# Patient Record
Sex: Male | Born: 1935 | Race: White | Hispanic: No | Marital: Married | Smoking: Former smoker
Health system: Southern US, Community
[De-identification: ages and names within clinical notes are randomized; demographics above are authoritative.]

## PROBLEM LIST (undated history)

## (undated) DIAGNOSIS — I495 Sick sinus syndrome: Secondary | ICD-10-CM

## (undated) DIAGNOSIS — C8307 Small cell B-cell lymphoma, spleen: Secondary | ICD-10-CM

## (undated) DIAGNOSIS — I4729 Other ventricular tachycardia: Secondary | ICD-10-CM

## (undated) DIAGNOSIS — I480 Paroxysmal atrial fibrillation: Secondary | ICD-10-CM

## (undated) DIAGNOSIS — I472 Ventricular tachycardia: Secondary | ICD-10-CM

## (undated) DIAGNOSIS — M40209 Unspecified kyphosis, site unspecified: Secondary | ICD-10-CM

## (undated) DIAGNOSIS — I2699 Other pulmonary embolism without acute cor pulmonale: Secondary | ICD-10-CM

## (undated) DIAGNOSIS — M810 Age-related osteoporosis without current pathological fracture: Secondary | ICD-10-CM

## (undated) DIAGNOSIS — E785 Hyperlipidemia, unspecified: Secondary | ICD-10-CM

## (undated) HISTORY — DX: Sick sinus syndrome: I49.5

## (undated) HISTORY — DX: Hyperlipidemia, unspecified: E78.5

## (undated) HISTORY — DX: Paroxysmal atrial fibrillation: I48.0

## (undated) HISTORY — DX: Ventricular tachycardia: I47.2

## (undated) HISTORY — DX: Other pulmonary embolism without acute cor pulmonale: I26.99

## (undated) HISTORY — DX: Small cell b-cell lymphoma, spleen: C83.07

## (undated) HISTORY — PX: PACEMAKER INSERTION: SHX728

## (undated) HISTORY — DX: Age-related osteoporosis without current pathological fracture: M81.0

## (undated) HISTORY — DX: Other ventricular tachycardia: I47.29

## (undated) HISTORY — DX: Unspecified kyphosis, site unspecified: M40.209

---

## 2001-03-09 ENCOUNTER — Ambulatory Visit (HOSPITAL_COMMUNITY): Admission: RE | Admit: 2001-03-09 | Discharge: 2001-03-09 | Payer: Self-pay | Admitting: Internal Medicine

## 2002-10-26 HISTORY — PX: TOTAL HIP ARTHROPLASTY: SHX124

## 2003-02-21 ENCOUNTER — Encounter: Payer: Self-pay | Admitting: Gastroenterology

## 2003-06-19 ENCOUNTER — Encounter: Admission: RE | Admit: 2003-06-19 | Discharge: 2003-09-07 | Payer: Self-pay

## 2003-08-23 ENCOUNTER — Inpatient Hospital Stay (HOSPITAL_COMMUNITY): Admission: EM | Admit: 2003-08-23 | Discharge: 2003-08-30 | Payer: Self-pay | Admitting: Emergency Medicine

## 2003-09-12 ENCOUNTER — Encounter (INDEPENDENT_AMBULATORY_CARE_PROVIDER_SITE_OTHER): Payer: Self-pay | Admitting: *Deleted

## 2003-09-23 ENCOUNTER — Inpatient Hospital Stay (HOSPITAL_COMMUNITY): Admission: EM | Admit: 2003-09-23 | Discharge: 2003-09-29 | Payer: Self-pay | Admitting: Emergency Medicine

## 2003-09-28 ENCOUNTER — Encounter: Payer: Self-pay | Admitting: Cardiology

## 2004-01-20 IMAGING — CT CT HEAD W/O CM
1 series · 15 of 30 positions shown, 19 images · non-contrast
Comparison: none

CLINICAL DATA: Subdural hematoma with drainage catheter placement. 
CT HEAD WITHOUT CONTRAST, 09/26/03

[Series 2: brain · axial · 0.47mm/px · z∈[+120,+261]mm · 15 of 30 slices shown, 19 images]
[im 2/30  brain]
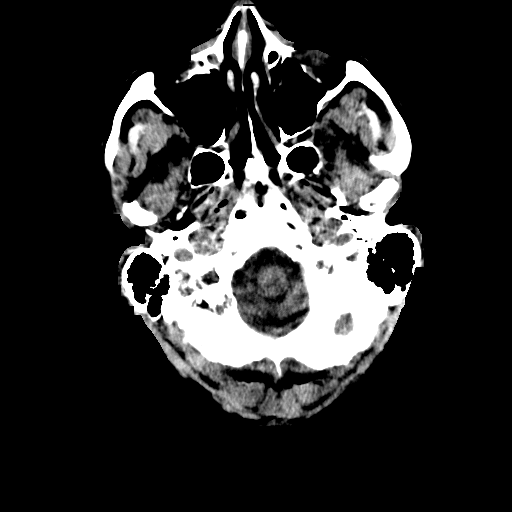
[im 2/30  bone]
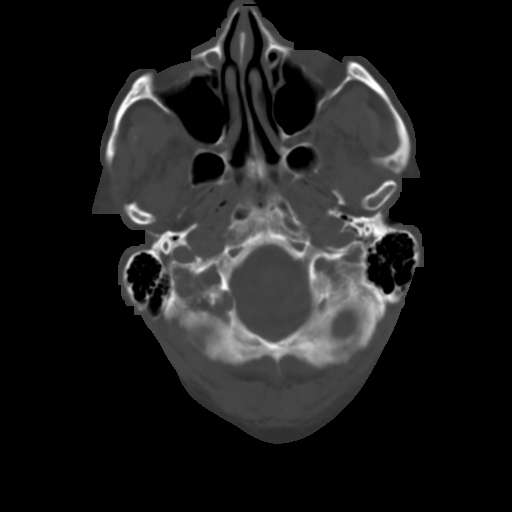
[im 4/30  brain]
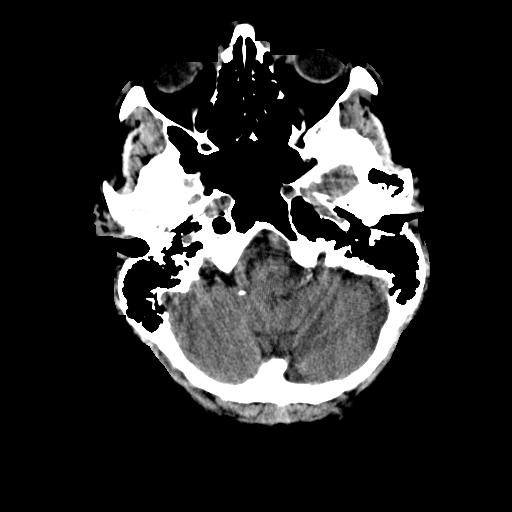
[im 6/30  brain]
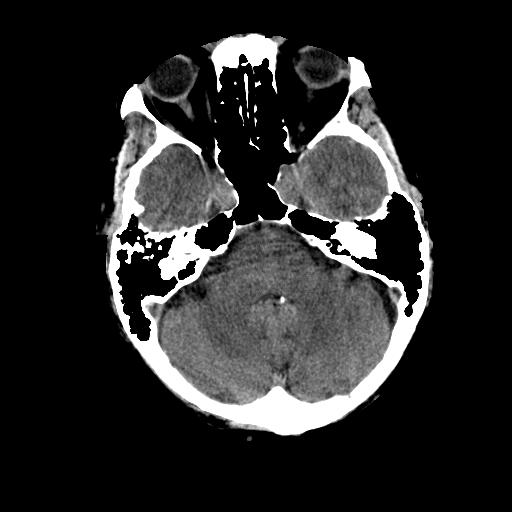
[im 8/30  brain]
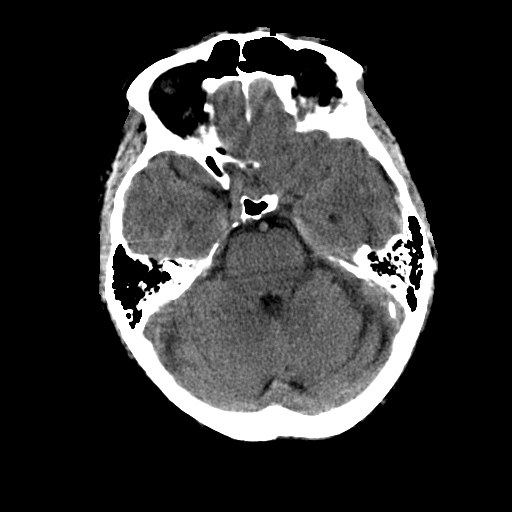
[im 10/30  brain]
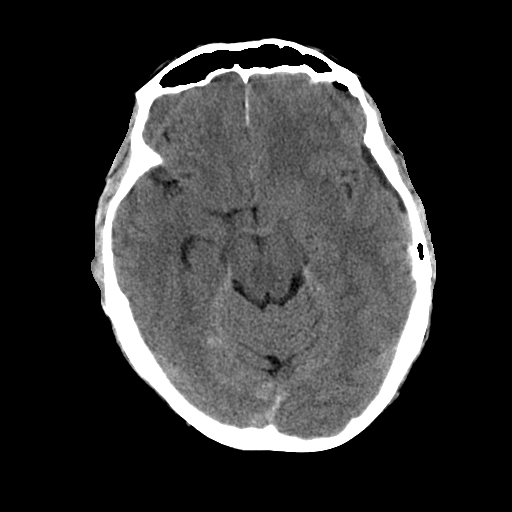
[im 10/30  bone]
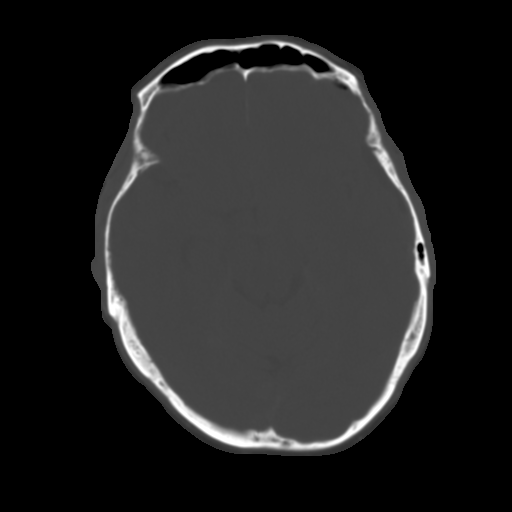
[im 12/30  brain]
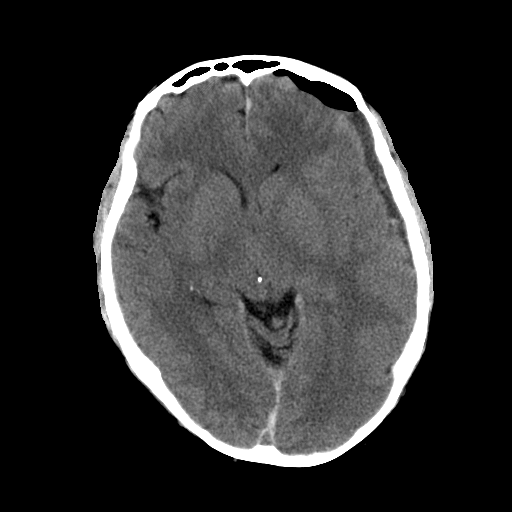
[im 14/30  brain]
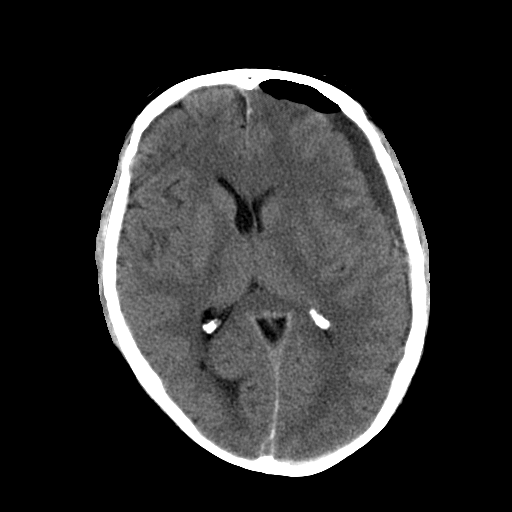
[im 16/30  brain]
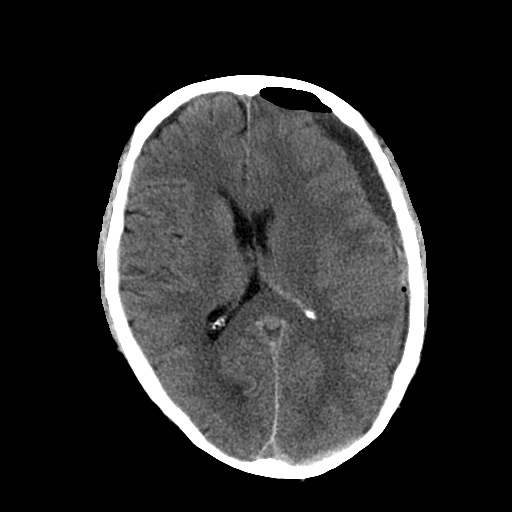
[im 17/30  brain]
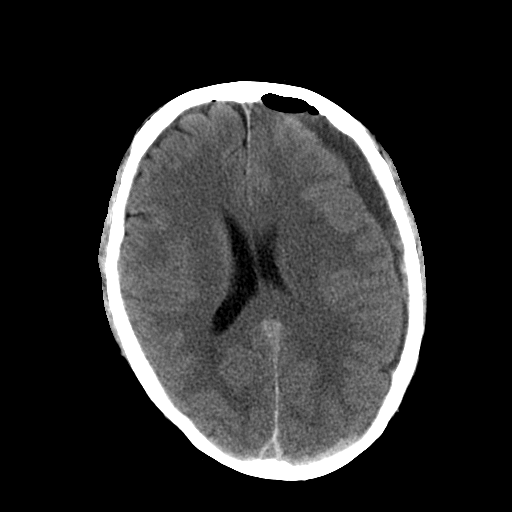
[im 17/30  bone]
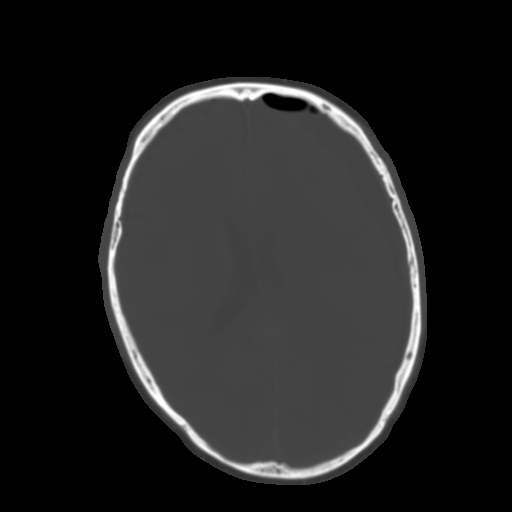
[im 19/30  brain]
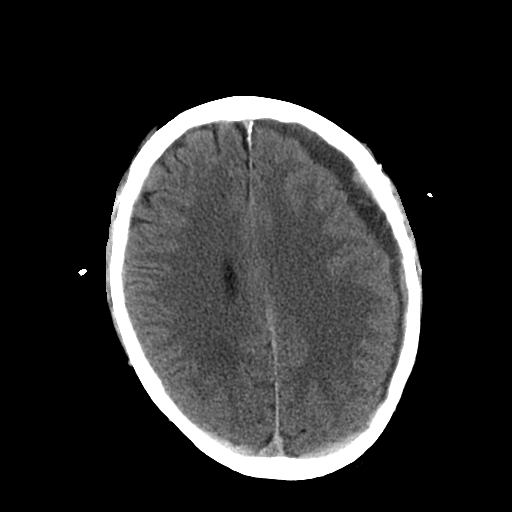
[im 21/30  brain]
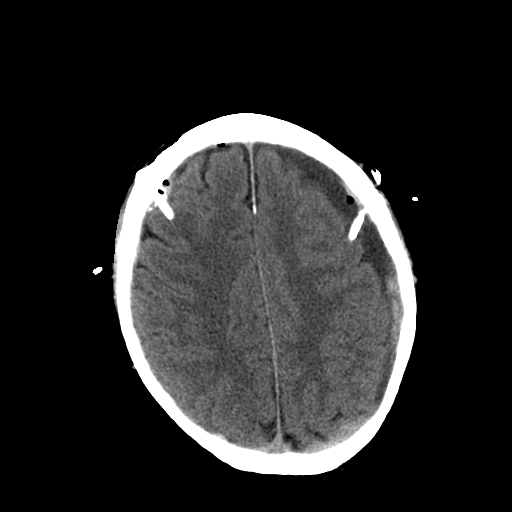
[im 23/30  brain]
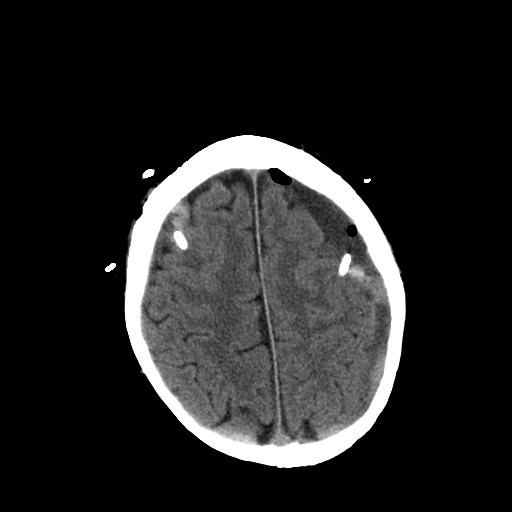
[im 25/30  brain]
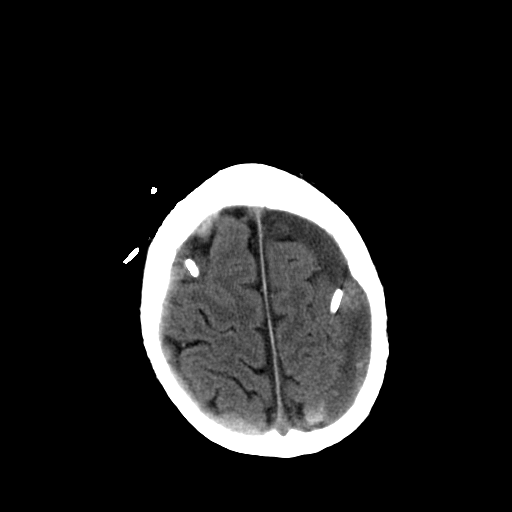
[im 25/30  bone]
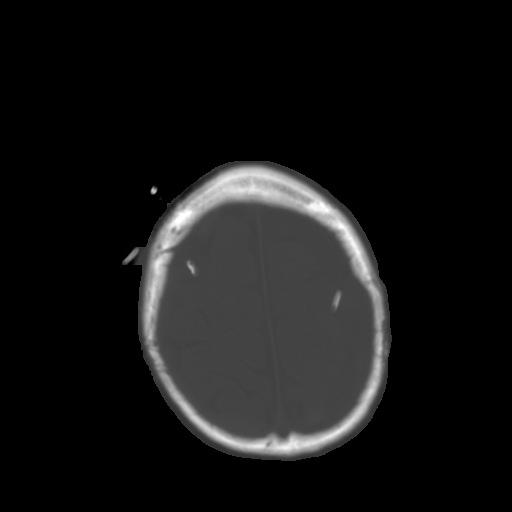
[im 27/30  brain]
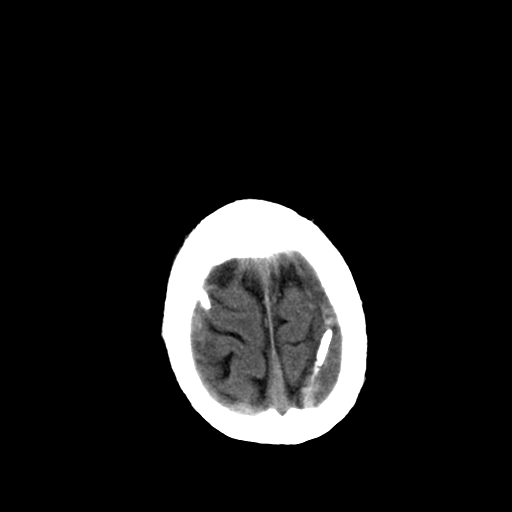
[im 29/30  brain]
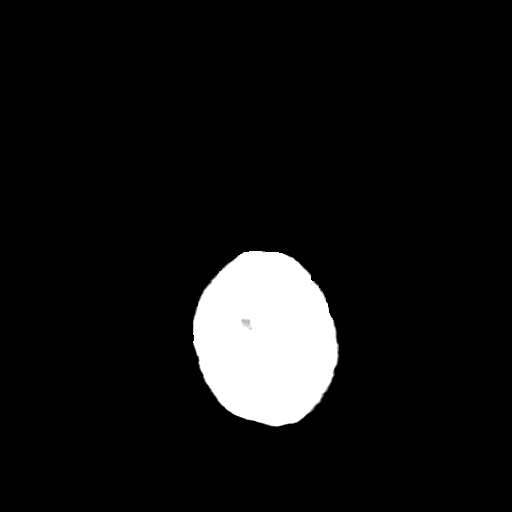

[15 of 30 positions shown; findings below may reference images not displayed]

FINDINGS: Comparison to 09/25/03.
Bilateral subdural drainage catheters are again noted with low density subdural fluid and areas of acute hemorrhage again noted.  There has been interval decrease in size of the right subdural collection with relatively stable left subdural fluid / hemorrhage.  There is increasing air within the left subdural space since the last study.  There is increasing mass effect on the left brain with increase in left to right midline shift of approximately 3-4 mm.  Mild mass effect on the left ventricular system is noted with minimal enlargement of the right lateral ventricle.  No other changes identified.  
IMPRESSION
Bilateral subdural drains again noted with decrease in right subdural fluid / hemorrhage and unchanged left subdural fluid / hemorrhage.  However increasing mass effect on the left frontal lobe with 3-4 mm left to right midline shift and slight increase in right ventricular size.  New left subdural air.  
Other scattered areas of subdural hematoma unchanged.

## 2006-01-26 ENCOUNTER — Ambulatory Visit: Payer: Self-pay | Admitting: Internal Medicine

## 2006-02-01 ENCOUNTER — Encounter: Payer: Self-pay | Admitting: Internal Medicine

## 2006-02-01 ENCOUNTER — Other Ambulatory Visit: Admission: RE | Admit: 2006-02-01 | Discharge: 2006-02-01 | Payer: Self-pay | Admitting: Internal Medicine

## 2006-02-01 LAB — CBC WITH DIFFERENTIAL/PLATELET
Basophils Absolute: 0 10*3/uL (ref 0.0–0.1)
HCT: 33.8 % — ABNORMAL LOW (ref 38.7–49.9)
HGB: 11.4 g/dL — ABNORMAL LOW (ref 13.0–17.1)
MCH: 32.2 pg (ref 28.0–33.4)
MONO#: 0.5 10*3/uL (ref 0.1–0.9)
NEUT%: 35.3 % — ABNORMAL LOW (ref 40.0–75.0)
lymph#: 4 10*3/uL — ABNORMAL HIGH (ref 0.9–3.3)

## 2006-02-02 LAB — IRON AND TIBC
%SAT: 35 % (ref 20–55)
Iron: 91 ug/dL (ref 42–165)
TIBC: 260 ug/dL (ref 215–435)
UIBC: 169 ug/dL

## 2006-02-02 LAB — FERRITIN: Ferritin: 145 ng/mL (ref 22–322)

## 2006-02-02 LAB — VITAMIN B12: Vitamin B-12: 725 pg/mL (ref 211–911)

## 2006-03-03 ENCOUNTER — Encounter: Admission: RE | Admit: 2006-03-03 | Discharge: 2006-03-03 | Payer: Self-pay | Admitting: Internal Medicine

## 2006-03-05 ENCOUNTER — Encounter (INDEPENDENT_AMBULATORY_CARE_PROVIDER_SITE_OTHER): Payer: Self-pay | Admitting: Specialist

## 2006-03-05 ENCOUNTER — Ambulatory Visit: Payer: Self-pay | Admitting: Internal Medicine

## 2006-03-05 ENCOUNTER — Encounter: Payer: Self-pay | Admitting: Internal Medicine

## 2006-03-05 ENCOUNTER — Ambulatory Visit (HOSPITAL_COMMUNITY): Admission: RE | Admit: 2006-03-05 | Discharge: 2006-03-05 | Payer: Self-pay | Admitting: Internal Medicine

## 2006-03-10 LAB — CBC WITH DIFFERENTIAL/PLATELET
BASO%: 0.3 % (ref 0.0–2.0)
Basophils Absolute: 0 10*3/uL (ref 0.0–0.1)
EOS%: 4.1 % (ref 0.0–7.0)
Eosinophils Absolute: 0.4 10*3/uL (ref 0.0–0.5)
HCT: 32.9 % — ABNORMAL LOW (ref 38.7–49.9)
HGB: 11.3 g/dL — ABNORMAL LOW (ref 13.0–17.1)
LYMPH%: 50.8 % — ABNORMAL HIGH (ref 14.0–48.0)
MCH: 33 pg (ref 28.0–33.4)
MCHC: 34.4 g/dL (ref 32.0–35.9)
MCV: 96 fL (ref 81.6–98.0)
MONO#: 0.7 10*3/uL (ref 0.1–0.9)
MONO%: 7.7 % (ref 0.0–13.0)
NEUT#: 3.3 10*3/uL (ref 1.5–6.5)
NEUT%: 37.1 % — ABNORMAL LOW (ref 40.0–75.0)
Platelets: 124 10*3/uL — ABNORMAL LOW (ref 145–400)
RBC: 3.43 10*6/uL — ABNORMAL LOW (ref 4.20–5.71)
RDW: 13.4 % (ref 11.2–14.6)
WBC: 8.8 10*3/uL (ref 4.0–10.0)
lymph#: 4.5 10*3/uL — ABNORMAL HIGH (ref 0.9–3.3)

## 2006-03-10 LAB — LACTATE DEHYDROGENASE: LDH: 195 U/L (ref 94–250)

## 2006-10-26 DIAGNOSIS — I495 Sick sinus syndrome: Secondary | ICD-10-CM

## 2006-10-26 HISTORY — DX: Sick sinus syndrome: I49.5

## 2007-07-13 ENCOUNTER — Encounter: Admission: RE | Admit: 2007-07-13 | Discharge: 2007-07-13 | Payer: Self-pay | Admitting: Cardiology

## 2007-07-26 ENCOUNTER — Ambulatory Visit (HOSPITAL_COMMUNITY): Admission: RE | Admit: 2007-07-26 | Discharge: 2007-07-27 | Payer: Self-pay | Admitting: *Deleted

## 2007-08-26 ENCOUNTER — Emergency Department (HOSPITAL_COMMUNITY): Admission: EM | Admit: 2007-08-26 | Discharge: 2007-08-26 | Payer: Self-pay | Admitting: Emergency Medicine

## 2007-12-20 IMAGING — CT CT PELVIS W/O CM
2 of 5 series · 4 of 32 positions shown, 7 images · IV contrast (agent unspecified)
Comparison: none

CLINICAL DATA: Fall.  Left hip pain.
HEAD CT WITHOUT CONTRAST:
TECHNIQUE: Contiguous axial images were obtained from the base of the skull through the vertex according to standard protocol without contrast.
TECHNIQUE: Multidetector CT imaging of the pelvis was performed following the standard protocol without IV contrast.

[Series 5: pelvis_hip 3.0 b60f st · axial · 0.74mm/px · z∈[+592,+708]mm · 2 of 117 slices shown, 5 images (1 of 2)]
[im 39/117  soft-tissue]
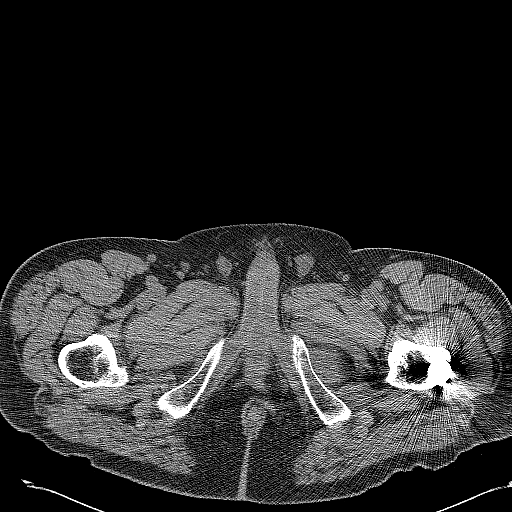
[im 39/117  lung]
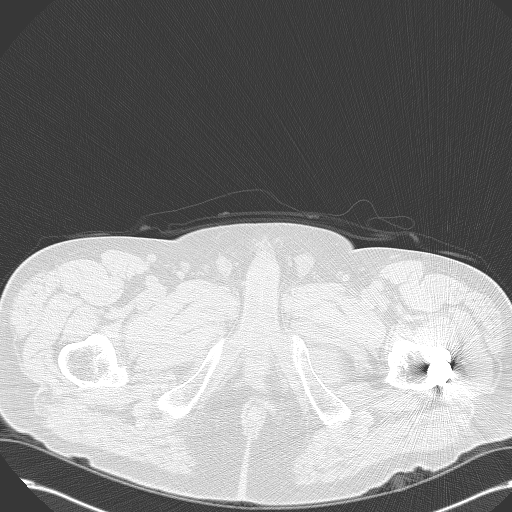
[im 39/117  bone]
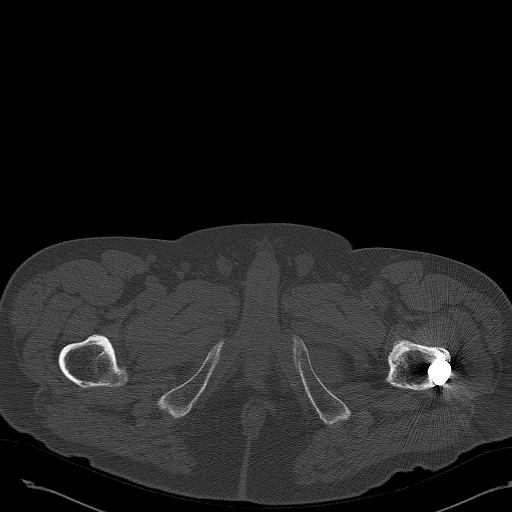
[im 78/117  soft-tissue]
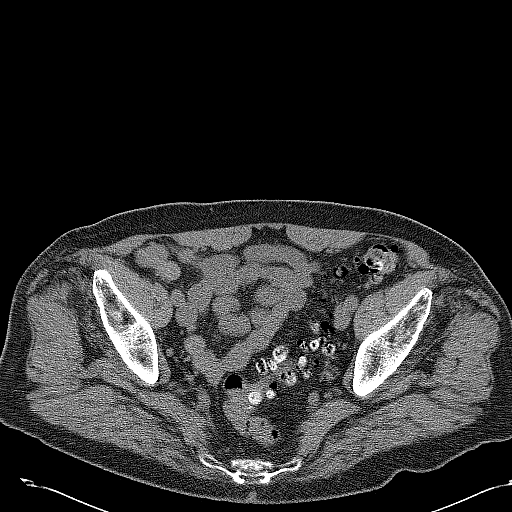
[im 78/117  lung]
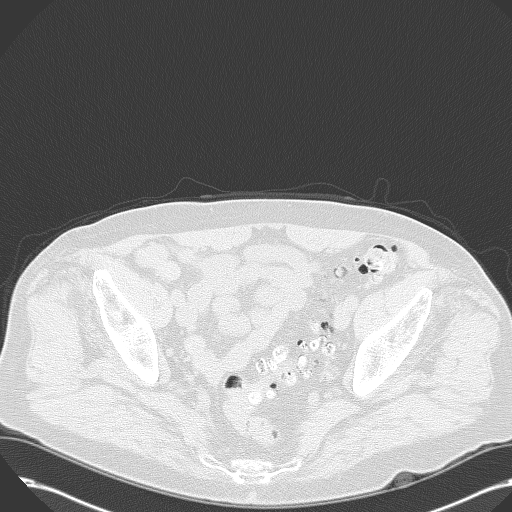

[Series 8: pelvis_hip 3.0 b60f st · axial · 0.74mm/px · z∈[+592,+708]mm · 2 of 117 slices shown (2 of 2)]
[im 39/117  soft-tissue]
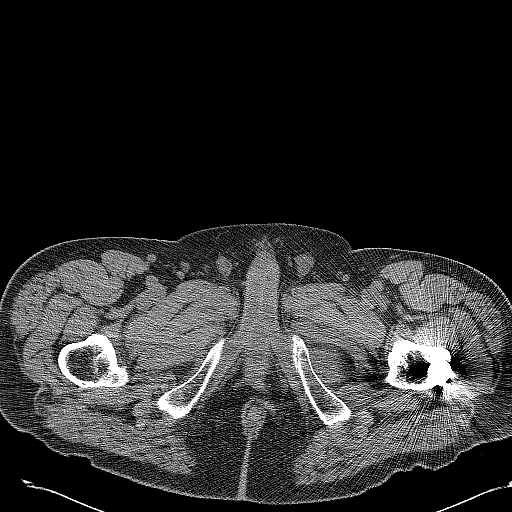
[im 78/117  soft-tissue]
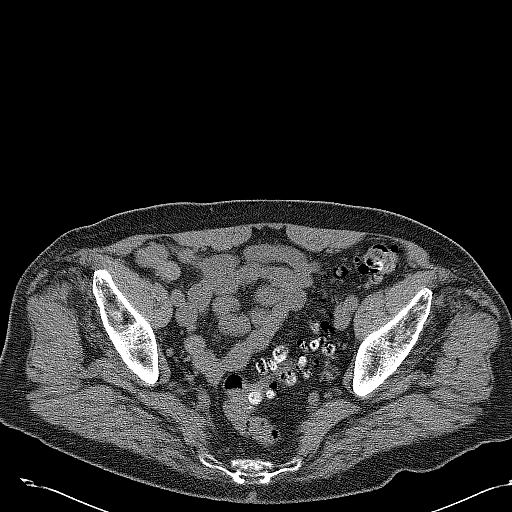

[4 of 32 positions shown; findings below may reference images not displayed]

FINDINGS: No intracranial hemorrhage.  No CT evidence of large acute infarct.  No intracranial mass detected on this unenhanced exam.  No skull fracture.
IMPRESSION: No intracranial hemorrhage or skull fracture. 
PELVIS CT WITHOUT CONTRAST:
FINDINGS: Prior surgery of the left hip with side plate and screws traversing through the femoral neck region.  Small acute fracture inferior left pubic ramus, otherwise no fracture noted.  Enlarged prostate gland.  Clinical and laboratory correlation is recommended.   Sigmoid diverticula.  Degenerative changes of the lower lumbar spine with multifactorial spinal stenosis.
IMPRESSION: 1. Fracture of the left inferior pubic ramus.  
2. Sigmoid diverticulosis.
3. Enlarged prostate gland.
4. Spinal stenosis, lower lumbar region.

## 2010-03-26 ENCOUNTER — Encounter: Payer: Self-pay | Admitting: Gastroenterology

## 2010-04-04 ENCOUNTER — Encounter: Payer: Self-pay | Admitting: Gastroenterology

## 2010-04-04 ENCOUNTER — Encounter (INDEPENDENT_AMBULATORY_CARE_PROVIDER_SITE_OTHER): Payer: Self-pay | Admitting: *Deleted

## 2010-05-06 ENCOUNTER — Encounter: Payer: Self-pay | Admitting: Gastroenterology

## 2010-05-26 ENCOUNTER — Ambulatory Visit: Payer: Self-pay | Admitting: Gastroenterology

## 2010-05-26 DIAGNOSIS — Z860100 Personal history of colon polyps, unspecified: Secondary | ICD-10-CM | POA: Insufficient documentation

## 2010-05-26 DIAGNOSIS — K5730 Diverticulosis of large intestine without perforation or abscess without bleeding: Secondary | ICD-10-CM | POA: Insufficient documentation

## 2010-06-03 ENCOUNTER — Ambulatory Visit: Payer: Self-pay | Admitting: Gastroenterology

## 2010-06-06 ENCOUNTER — Encounter: Payer: Self-pay | Admitting: Gastroenterology

## 2010-11-25 NOTE — Letter (Signed)
Summary: [facility]  [facility]   Imported By: Lester [place] 06/03/2010 09:01:47  _____________________________________________________________________  External Attachment:    Type:   Image     Comment:   External Document

## 2010-11-25 NOTE — Letter (Signed)
Summary: [facility]  [facility]   Imported By: Lester [place] 06/03/2010 08:58:30  _____________________________________________________________________  External Attachment:    Type:   Image     Comment:   External Document

## 2010-11-25 NOTE — Letter (Signed)
Summary: New Patient letter  [facility]  [address] [place], [place] [address]   Phone: [phone]  Fax: [phone]       04/04/2010 MRN: [id number]  [facility] [address] [place], [place]  [address]  Dear David [name],  Welcome to the Gastroenterology Division at [facility].    You are scheduled to see Dr.  Russella Dar on 05-26-10 at 10am on the 3rd floor at [facility], [address]  We ask that you try to arrive at our office 15 minutes prior to your appointment time to allow for check-in.  We would like you to complete the enclosed self-administered evaluation form prior to your visit and bring it with you on the day of your appointment.  We will review it with you.  Also, please bring a complete list of all your medications or, if you prefer, bring the medication bottles and we will list them.  Please bring your insurance card so that we may make a copy of it.  If your insurance requires a referral to see a specialist, please bring your referral form from your primary care physician.  Co-payments are due at the time of your visit and may be paid by cash, check or credit card.     Your office visit will consist of a consult with your physician (includes a physical exam), any laboratory testing he/she may order, scheduling of any necessary diagnostic testing (e.g. x-ray, ultrasound, CT-scan), and scheduling of a procedure (e.g. Endoscopy, Colonoscopy) if required.  Please allow enough time on your schedule to allow for any/all of these possibilities.    If you cannot keep your appointment, please call [phone] to cancel or reschedule prior to your appointment date.  This allows [place] the opportunity to schedule an appointment for another patient in need of care.  If you do not cancel or reschedule by 5 p.m. the business day prior to your appointment date, you will be charged a $50.00 late cancellation/no-show fee.    Thank you for  choosing [facility] for your medical needs.  We appreciate the opportunity to care for you.  Please visit [place] at our website  to learn more about our practice.                     Sincerely,                                                             The Gastroenterology Division

## 2010-11-25 NOTE — Discharge Summary (Signed)
Summary: Pulmonary Embolism   NAME:  David [name], David [name]                       ACCOUNT NO.:  [id number]   MEDICAL RECORD NO.:  [id number]                   PATIENT TYPE:  INP   LOCATION:  5150                                 FACILITY:  [facility]   PHYSICIAN:  David A. Perini, M.D.                DATE OF BIRTH:  07-13-1926   DATE OF ADMISSION:  08/23/2003  DATE OF DISCHARGE:  08/30/2003                                 DISCHARGE SUMMARY   DISCHARGE DIAGNOSES:  1. Moderate-sized left lower lobe distribution pulmonary embolism.  2. Recent open reduction internal fixation of a left hip fracture in August     of 2004.  3. Hemoptysis due to #1, resolving at the time of discharge.  4. Allergic rhinitis.   HISTORY OF PRESENT ILLNESS:  David [name] is a very pleasant 77 year old  gentleman with a fairly benign past medical history; however, he did suffer  a left hip fracture in August of 2004 and underwent open reduction internal  fixation. He was treated with anticoagulation for a few weeks and then this  was discontinued, and he was placed on aspirin. Recently, the patient did  have some swelling and pain in his left lower extremity. Furthermore, he had  been riding in his car on a few trips recently. He awoke at 4 a.m. on the  morning of admission with a sudden onset of chest pain which was pleuritic  in nature. He called our office and was advised to present to the emergency  room. There he was subsequently found to have a pulmonary embolism involving  his left lower lobe pulmonary arterial distribution. There was also some  sign of pulmonary infarct.   HOSPITAL COURSE:  David [name] was admitted to a telemetry bed. He was  placed on Lovenox and Coumadin therapy. He did have some slight hemoptysis  which resolved readily. He remained stable from a cardiovascular standpoint  and did not have significant hypoxia. He did have some low grade  temperatures initially which resolved without further  measures. He was  somewhat resistant to Coumadin and required higher doses to obtain an INR of  greater than 2.0. By August 30, 2003, he was therapeutic on his  anticoagulation, and he was discharged home to the care of his family.   DISCHARGE PHYSICAL EXAM:  VITAL SIGNS:  The patient was afebrile.  Temperature 98, pulse 50, respiratory rate 20, blood pressure 109/67, 96%  saturation on room air.  LUNGS:  Lung exam revealed essentially clear lungs bilaterally.  HEART:  Heart was regular rate and rhythm with no murmur.  ABDOMEN:  Abdomen was soft and benign.  EXTREMITIES:  There was no peripheral edema noted.   DISCHARGE LABORATORY DATA:  White count 7.9 with a normal differential,  hemoglobin 12.2, platelet count 261,000. INR 2.0. Sodium 139, potassium 4.2,  chloride 105, CO2 28, BUN 22, creatinine 1.3, glucose 95. ALT 45, albumin  2.8.   DISCHARGE INSTRUCTIONS:  David [name] was discharged on Coumadin therapy. He  was advised on Coumadin education. He is to followup promptly in the office  for frequent INR checks at our office. He is to followup with [place] in one week  for outpatient followup. He is to call if there are any questions or  concerns. He was also discharged on some p.r.n. Vicodin as well.                                                David [name], M.D.    MAP/MEDQ  D:  09/12/2003  T:  09/13/2003  Job:  [id number]

## 2010-11-25 NOTE — Letter (Signed)
Summary: Patient Notice- Polyp Results  [facility]  [address] [place], [place] [address]   Phone: [phone]  Fax: [phone]        June 06, 2010 MRN: [id number]    [facility] [address] [place], [place]  [address]    Dear David [name],  I am pleased to inform you that the colon polyp(s) removed during your recent colonoscopy was (were) found to be benign (no cancer detected) upon pathologic examination.  I recommend you have a repeat colonoscopy examination in 3 years to look for recurrent polyps, as having colon polyps increases your risk for having recurrent polyps or even colon cancer in the future.  Should you develop new or worsening symptoms of abdominal pain, bowel habit changes or bleeding from the rectum or bowels, please schedule an evaluation with either your primary care physician or with me.  Continue treatment plan as outlined the day of your exam.  Please call us if you are having persistent problems or have questions about your condition that have not been fully answered at this time.  Sincerely,  Meryl Dare MD [facility]  This letter has been electronically signed by your physician.  Appended Document: Patient Notice- Polyp Results letter mailed

## 2010-11-25 NOTE — Letter (Signed)
Summary: [facility] Instructions  [facility]  [address] [place], [place] [address]   Phone: [phone]  Fax: [phone]       David [name]    Jul 23, 1926    MRN: [id number]        Procedure Day /Date: Tuesday August 9th, 2011     Arrival Time: 3:00pm     Procedure Time: 4:00pm     Location of Procedure:                    _ x_  [facility] (4th Floor)                        PREPARATION FOR COLONOSCOPY WITH MOVIPREP   Starting 5 days prior to your procedure 05/29/10 do not eat nuts, seeds, popcorn, corn, beans, peas,  salads, or any raw vegetables.  Do not take any fiber supplements (e.g. Metamucil, Citrucel, and Benefiber).  THE DAY BEFORE YOUR PROCEDURE         DATE: 06/02/10  [id number]  1.  Drink clear liquids the entire day-NO SOLID FOOD  2.  Do not drink anything colored red or purple.  Avoid juices with pulp.  No orange juice.  3.  Drink at least 64 oz. (8 glasses) of fluid/clear liquids during the day to prevent dehydration and help the prep work efficiently.  CLEAR LIQUIDS INCLUDE: Water Jello Ice Popsicles Tea (sugar ok, no milk/cream) Powdered fruit flavored drinks Coffee (sugar ok, no milk/cream) Gatorade Juice: apple, white grape, white cranberry  Lemonade Clear bullion, consomm, broth Carbonated beverages (any kind) Strained chicken noodle soup Hard Candy                             4.  In the morning, mix first dose of MoviPrep solution:    Empty 1 Pouch A and 1 Pouch B into the disposable container    Add lukewarm drinking water to the top line of the container. Mix to dissolve    Refrigerate (mixed solution should be used within 24 hrs)  5.  Begin drinking the prep at 5:00 p.m. The MoviPrep container is divided by 4 marks.   Every 15 minutes drink the solution down to the next mark (approximately 8 oz) until the full liter is complete.   6.  Follow completed prep with 16 oz of clear liquid of your choice  (Nothing red or purple).  Continue to drink clear liquids until bedtime.  7.  Before going to bed, mix second dose of MoviPrep solution:    Empty 1 Pouch A and 1 Pouch B into the disposable container    Add lukewarm drinking water to the top line of the container. Mix to dissolve    Refrigerate  THE DAY OF YOUR PROCEDURE      DATE: 06/03/10 DAY: Tuesday  Beginning at 11:00 a.m. (5 hours before procedure):         1. Every 15 minutes, drink the solution down to the next mark (approx 8 oz) until the full liter is complete.  2. Follow completed prep with 16 oz. of clear liquid of your choice.    3. You may drink clear liquids until 2:00pm (2 HOURS BEFORE PROCEDURE).   MEDICATION INSTRUCTIONS  Unless otherwise instructed, you should take regular prescription medications with a small sip of water   as early as possible the morning of your procedure.  Stop taking Coumadin on  _ _  (5 days before procedure).  Additional medication instructions: You will be contaced by our office prior to your procedure for directions on holding your Coumadin/Warfarin.  If you do not hear from our office 1 week prior to your scheduled procedure, please call [phone] to discuss.    Stop Iron 7 days before your procedure.      OTHER INSTRUCTIONS  You will need a responsible adult at least 75 years of age to accompany you and drive you home.   This person must remain in the waiting room during your procedure.  Wear loose fitting clothing that is easily removed.  Leave jewelry and other valuables at home.  However, you may wish to bring a book to read or  an iPod/MP3 player to listen to music as you wait for your procedure to start.  Remove all body piercing jewelry and leave at home.  Total time from sign-in until discharge is approximately 2-3 hours.  You should go home directly after your procedure and rest.  You can resume normal activities the  day after your procedure.  The day of  your procedure you should not:   Drive   Make legal decisions   Operate machinery   Drink alcohol   Return to work  You will receive specific instructions about eating, activities and medications before you leave.    The above instructions have been reviewed and explained to me by   Marchelle Folks.     I fully understand and can verbalize these instructions _____________________________ Date _________

## 2010-11-25 NOTE — Procedures (Signed)
Summary: Colonoscopy   Colonoscopy  Procedure date:  02/21/2003  Findings:      Results: Hemorrhoids.     Results: Diverticulosis.       Pathology:  Adenomatous polyp.        Location:  [facility].    Procedures Next Due Date:    Colonoscopy: 02/2006 Patient Name: David [name], David [name] MRN:  Procedure Procedures: Colonoscopy CPT: [address].    with polypectomy. CPT: A3573898.  Personnel: Endoscopist: Venita Lick. Russella Dar, MD, Clementeen Graham.  Referred By: Rodrigo Ran, MD.  Exam Location: Exam performed in Outpatient Clinic. Outpatient  Patient Consent: Procedure, Alternatives, Risks and Benefits discussed, consent obtained, from patient. Consent was obtained by the RN.  Indications  Average Risk Screening Routine.  History  Pre-Exam Physical: Performed Feb 21, 2003. Entire physical exam was normal.  Exam Exam: Extent of exam reached: Cecum, extent intended: Cecum.  The cecum was identified by appendiceal orifice and IC valve. Colon retroflexion performed. ASA Classification: II. Tolerance: good.  Monitoring: Pulse and BP monitoring, Oximetry used. Supplemental O2 given.  Colon Prep Used Golytely for colon prep. Prep results: good.  Sedation Meds: Patient assessed and found to be appropriate for moderate (conscious) sedation. Fentanyl 100 mcg. given IV. Versed 8 mg. given IV.  Findings POLYP: Transverse Colon, Maximum size: 8 mm. sessile polyp. Procedure:  snare with cautery, removed, retrieved, Polyp sent to pathology. ICD9: Colon Polyps: 211.3.  NORMAL EXAM: Splenic Flexure to Descending Colon.  - DIVERTICULOSIS: Sigmoid Colon. Not bleeding. ICD9: Diverticulosis: 562.10.  NORMAL EXAM: Cecum to Hepatic Flexure.  HEMORRHOIDS: Internal and External. Size: Medium. Not bleeding. Not thrombosed. ICD9: Hemorrhoids, Internal and  External: 455.6.   Assessment  Diagnoses: 211.3: Colon Polyps.  562.10: Diverticulosis.  455.6: Hemorrhoids, Internal and  External.    Events  Unplanned Interventions: No intervention was required.  Unplanned Events: There were no complications. Plans  Post Exam Instructions: No aspirin or non-steroidal containing medications: 2 weeks.  Medication Plan: Await pathology. Continue current medications.  Patient Education: Patient given standard instructions for: Polyps. Diverticulosis. Hemorrhoids.  Disposition: After procedure patient sent to recovery. After recovery patient sent home.  Scheduling/Referral: Colonoscopy, to [facility] T. Russella Dar, MD, [facility], around Feb 20, 2006.  Primary Care Provider, to Rodrigo Ran, MD,    This report was created from the original endoscopy report, which was reviewed and signed by the above listed endoscopist.    cc: Rodrigo Ran, MD

## 2010-11-25 NOTE — Assessment & Plan Note (Signed)
Summary: SCREEN FOR RECALL COLON/YF   History of Present Illness Visit Type: consult Primary GI MD: Elie Goody MD [facility] Primary Provider: Rodrigo Ran, MD Requesting Provider: Guerry Bruin, MD Chief Complaint: pt denies any problems.  Dr. Wylene Simmer wanted pt to be seen for folow up colonoscopy History of Present Illness:   This is an 77 year old male is here today with his wife. He has a history of adenomatous colon polyps diagnosed in April 2004. A three-year surveillance colonoscopy was recommended and he did not return for follow up. He has no ongoing colorectal complaints.  He has a chronic anemia that has been evaluated by Dr. Arbutus Ped and he is maintained on iron for several years. Recent Hemosure testing was negative. He has a prior history of DVT and he is maintained on warfarin. He has hyperlipidemia, osteoporosis and is status post pacemaker placement for bradycardia.   GI Review of Systems    Reports bloating.      Denies abdominal pain, acid reflux, belching, chest pain, dysphagia with liquids, dysphagia with solids, heartburn, loss of appetite, nausea, vomiting, vomiting blood, and  weight loss.      Reports diverticulosis and  hemorrhoids.     Denies anal fissure, black tarry stools, change in bowel habit, constipation, diarrhea, fecal incontinence, heme positive stool, irritable bowel syndrome, jaundice, light color stool, liver problems, rectal bleeding, and  rectal pain. Preventive Screening-Counseling & Management  Alcohol-Tobacco     Smoking Status: quit      Drug Use:  no.     Current Medications (verified): 1)  Warfarin Sodium 5 Mg Tabs (Warfarin Sodium) .... One Tablet By Mouth On Sunday, Tuesday, and Thursday, 1 1/2 Tablet By Mouth On Monday, Wed, Friday, and Saturday 2)  Zocor 80 Mg Tabs (Simvastatin) .... 1/2 Tablet By Mouth Once Daily 3)  Calcium-Vitamin D 250-125 Mg-Unit Tabs (Calcium Carbonate-Vitamin D) .... One Tablet By Mouth Two Times A Day 4)   Vitamin D 1000 Unit Tabs (Cholecalciferol) .... One Tablet By Mouth Once Daily 5)  Fosamax 70 Mg Tabs (Alendronate Sodium) .... One Tablet By Mouth Once A Week 6)  Allopurinol 100 Mg Tabs (Allopurinol) .... One Tablet By Mouth Once Daily 7)  Ferrous Gluconate 240 (27 Fe) Mg Tabs (Ferrous Gluconate) .... One Tablet By Mouth Once Daily 8)  Uroxatral 10 Mg Xr24h-Tab (Alfuzosin Hcl) .... Take One Tablet By Mouth Every Other Day 9)  Hydrocodone-Acetaminophen 5-500 Mg Tabs (Hydrocodone-Acetaminophen) .... Use As Needed 10)  Multivitamins  Tabs (Multiple Vitamin) .... Take 1 Tablet By Mouth Once A Day 11)  Fish Oil 1000 Mg Caps (Omega-3 Fatty Acids) .... Take 1 Capsule By Mouth Two Times A Day 12)  Saline Nasal Spray 0.65 % Soln (Saline) .... Use As Needed  Allergies (verified): No Known Drug Allergies  Past History:  Past Medical History: Reviewed history from 05/23/2010 and no changes required. Diverticulosis Hemorrhoids Adenomatous Colon Polyps 01/2003 Hyperlipidemia DVT  PE 07/2003 Osteoporosis Left leg cellulitis BPH ED Subdural hematoma bilateral 08/2003 Sinus bradycardia Allergic rhinitis  Gout  Past Surgical History: T & A L humerus fx Bone marrow bx Pelvix fx Mulitple vertebral compression fxs Pacemaker placement IVC filter placement  Family History: No FH of Colon Cancer:  Social History: Married, 1 boy, 1 girl Retired Patient is a former smoker.  Alcohol Use - yes--occasional Daily Caffeine Use-1/day Illicit Drug Use - no Smoking Status:  quit Drug Use:  no  Review of Systems       The patient  complains of allergy/sinus and arthritis/joint pain.         The pertinent positives and negatives are noted as above and in the HPI. All other ROS were reviewed and were negative.   Vital Signs:  Patient profile:   77 year old male Height:      70 inches Weight:      196 pounds BMI:     28.22 Pulse rate:   64 / minute Pulse rhythm:   regular BP sitting:    110 / 64  (left arm) Cuff size:   regular  Vitals Entered By: Francee Piccolo CMA Duncan Dull) (May 26, 2010 9:56 AM)  Physical Exam  General:  Well developed, well nourished, no acute distress. Head:  Normocephalic and atraumatic. Eyes:  PERRLA, no icterus. Ears:  Normal auditory acuity. Mouth:  No deformity or lesions, dentition normal. Neck:  Supple; no masses or thyromegaly. Lungs:  Clear throughout to auscultation. Heart:  Regular rate and rhythm; no murmurs, rubs,  or bruits. Abdomen:  Soft, nontender and nondistended. No masses, hepatosplenomegaly or hernias noted. Normal bowel sounds. Rectal:  deferred until time of colonoscopy.   Msk:  Symmetrical with no gross deformities. Normal posture. Walks with a cane. Pulses:  Normal pulses noted. Extremities:  No clubbing, cyanosis, edema or deformities noted. Neurologic:  Alert and  oriented x4;  grossly normal neurologically. Cervical Nodes:  No significant cervical adenopathy. Inguinal Nodes:  No significant inguinal adenopathy. Psych:  Alert and cooperative. Normal mood and affect.  Impression & Recommendations:  Problem # 1:  PERSONAL HISTORY OF COLONIC POLYPS (ICD-V12.72) Prior history of adenomatous colon polyps, initially diagnosed in April 2004. He is overdue for surveillance colonoscopy. Given his age, comorbidities, requiring Coumadin, and recently negative Hemosure testing, we discussed the option of not proceeding with colonoscopy. After discussing he states he would like to proceed with colonoscopy. He understands the risks, benefits, and alternatives to 5 day hold Coumadin anticoagulation. We will obtain clearance from Dr. Waynard Edwards. The risks, benefits and alternatives to colonoscopy with possible biopsy and possible polypectomy were discussed with the patient and they consent to proceed. The procedure will be scheduled electively. Orders: Colonoscopy (Colon)  Problem # 2:  ENCOUNTER FOR LONG-TERM USE OF ANTICOAGULANTS  (ICD-V58.61) As in problem #1.  Problem # 3:  DIVERTICULOSIS-COLON (ICD-562.10) Long-term high fiber diet with adequate daily water intake.  Patient Instructions: 1)  Pick up your prep from your pharmacy.  2)  Colonoscopy brochure given.  3)  Copy sent to : Rodrigo Ran, MD 4)  The medication list was reviewed and reconciled.  All changed / newly prescribed medications were explained.  A complete medication list was provided to the patient / caregiver.  Prescriptions: MOVIPREP 100 GM  SOLR (PEG-KCL-NACL-NASULF-NA ASC-C) As per prep instructions.  #1 x 0   Entered by:   Christie Nottingham CMA (AAMA)   Authorized by:   Meryl Dare MD [facility]   Signed by:   Meryl Dare MD [facility] on 05/26/2010   Method used:   Electronically to        [facility]. [phone]* (retail)       [address]       [place], [place]  [address]       Ph: [phone]       Fax: [phone]   RxID:   [id number]

## 2010-11-25 NOTE — Procedures (Signed)
Summary: Colonoscopy  Patient: David [name] Note: All result statuses are Final unless otherwise noted.  Tests: (1) Colonoscopy (COL)   COL Colonoscopy           DONE     [facility]     [address]     [place], [place]  [address]           COLONOSCOPY PROCEDURE REPORT           PATIENT:  David [name], David [name]  MR#:  [id number]     BIRTHDATE:  [birth year], 84 yrs. old  GENDER:  male     ENDOSCOPIST:  Judie Petit T. Russella Dar, MD, [facility]           PROCEDURE DATE:  06/03/2010     PROCEDURE:  Colonoscopy with snare polypectomy     ASA CLASS:  Class III     INDICATIONS:  1) surveillance and high-risk screening  2)     follow-up of polyp, adenomatous polyp, 01/2003.     MEDICATIONS:   Fentanyl 50 mcg IV, Versed 4 mg IV     DESCRIPTION OF PROCEDURE:   After the risks benefits and     alternatives of the procedure were thoroughly explained, informed     consent was obtained.  Digital rectal exam was performed and     revealed no abnormalities.   The LB PCF-[id number] endoscope     was introduced through the anus and advanced to the cecum, which     was identified by both the appendix and ileocecal valve, without     limitations.  The quality of the prep was excellent, using     MoviPrep.  The instrument was then slowly withdrawn as the colon     was fully examined.     <<PROCEDUREIMAGES>>     FINDINGS:  A sessile polyp was found in the cecum. It was 7 mm in     size. Polyp was snared without cautery. Retrieval was successful.     A sessile polyp was found in the ascending colon. It was 10 mm in     size. Polyp was snared, then cauterized with monopolar cautery.     Retrieval was successful.  A sessile polyp was found in the     sigmoid colon. It was 5 mm in size. Polyp was snared without     cautery. Retrieval was successful. Moderate diverticulosis was     found in the sigmoid to descending colon. This was otherwise a     normal examination of the colon. Retroflexed views in the  rectum     revealed internal hemorrhoids, large. The time to cecum =  2.5     minutes. The scope was then withdrawn (time =  11.5  min) from the     patient and the procedure completed.           COMPLICATIONS:  None           ENDOSCOPIC IMPRESSION:     1) 7 mm sessile polyp in the cecum     2) 10 mm sessile polyp in the ascending colon     3) 5 mm sessile polyp in the sigmoid colon     4) Moderate diverticulosis in the sigmoid to descending colon     5) Internal hemorrhoids           RECOMMENDATIONS:     1) No aspirin or NSAID's for 2 weeks     2) Await pathology results  3) Resume Coumadin (warfarin) today     4) High fiber diet with liberal fluid intake.     5) If the polyps removed today are adenomatous (pre-cancerous),     consider a colonoscopy in 3 years. Otherwise would not plan for     future surveillance colonoscopies given age and comorbidities.           Venita Lick. Russella Dar, MD, [facility]           CC: Rodrigo Ran, MD           n.     Rosalie DoctorVenita Lick. Stark at 06/03/2010 04:24 PM           Veryl Speak, [phone]  Note: An exclamation mark (!) indicates a result that was not dispersed into the flowsheet. Document Creation Date: 06/03/2010 4:25 PM _______________________________________________________________________  (1) Order result status: Final Collection or observation date-time: 06/03/2010 16:00 Requested date-time:  Receipt date-time:  Reported date-time:  Referring Physician:   Ordering Physician: Claudette Head [phone]) Specimen Source:  Source: Launa Grill Order Number: [id number] Lab site:

## 2010-11-25 NOTE — Letter (Signed)
Summary: Anticoagulation Modification Letter  [facility]  [address] [place], [place] [address]   Phone: [phone]  Fax: [phone]    May 26, 2010  Re:    David [name] DOB:    [birth year] MRN:    [id number]    Dear Dr. Waynard Edwards,   We have scheduled the above patient for an endoscopic procedure. Our records show that  he/she is on anticoagulation therapy. Please advise as to how long the patient may come off their therapy of warfarin prior to the scheduled procedure(s) on 06/03/10.   Please fax back/or route the completed form to [place] at [phone].  Thank you for your help with this matter.  Sincerely,  Christie Nottingham CMA Duncan Dull)   Physician Recommendation:  Hold Plavix 7 days prior ________________  Hold Coumadin 5 days prior ____________  Other ______________________________     Appended Document: Anticoagulation Modification Letter Pt notified to come off coumadin 5 days before procedure per Dr. Waynard Edwards.

## 2010-11-25 NOTE — Letter (Signed)
Summary: Anticoagulation/[facility]  Anticoagulation/[facility]   Imported By: Sherian Rein 05/29/2010 12:16:57  _____________________________________________________________________  External Attachment:    Type:   Image     Comment:   External Document

## 2011-03-10 NOTE — Op Note (Signed)
NAMEJARQUEZ, MESTRE NO.:  [id number]   MEDICAL RECORD NO.:  [id number]          PATIENT TYPE:  OIB   LOCATION:  2899                         FACILITY:  [facility]   PHYSICIAN:  Darlin Priestly, MD  DATE OF BIRTH:  [birth year]   DATE OF PROCEDURE:  07/26/2007  DATE OF DISCHARGE:                               OPERATIVE REPORT   PROCEDURE:  Insertion of a Medtronic Adapta ADDR01 generator, serial  [id number], with active atrial and ventricular leads.   ATTENDING:  Darlin Priestly, M.D.   COMPLICATIONS:  None.   INDICATIONS FOR PROCEDURE:  Mr. Ebron is a 77 year old male patient  of Dr. Rodrigo Ran and Dr. Yates Decamp, with a history of hyperlipidemia,  history of symptomatic bradycardia, history of DVT with subsequent  Greenfield filter and pulmonary embolism on chronic Coumadin therapy.  He is now referred for a dual chamber pacer implant secondary to  symptomatic bradycardia.   DESCRIPTION OF PROCEDURE:  After giving informed written consent, the  patient was brought to the cardiac cath lab where the left chest was  shaved, prepped and sterile fashion.  ECG monitoring was established.  1% lidocaine was then used to anesthetize the left mid subclavicular  area.  Next, an approximately 3 cm horizontal mid infraclavicular  incision was carried out and hemostasis obtained with electrocautery.  Blunt dissection was used to carry this down to the left pectoral  fascia.  Next, an approximately 3-4 cm pocket was created in the left  pectoral fascia and hemostasis obtained with electrocautery.  The left  subclavian vein was then entered with one retained guidewire.  Over the  retained guidewire, a 9 French dilator and sheath was then inserted over  the retained guide wire and the dilator and guidewire were removed.  Through this, a 58 cm active Medtronic lead, model number 5076, serial  number [id number], was then passed into the right atrium, the guidewire  was  retained, the peel away sheath was removed.  Over the retained  guidewire, a 7-French dilator and sheath were then easily placed in the  left subclavian vein and the dilator and sheath removed.  Through this,  a 52 cm active Medtronic lead, model 5076, serial number [id number], was  then placed into the right atrium.  The peel away sheath was removed.  A  J curve was then placed on the ventricular lead stylet and the  ventricular lead was able to prolapse the tricuspid valve and positioned  in the RV apex without difficulty.  We were able to get excellent R  waves and capture 2 volts, the screw was then extended, and pacing was  determined.  R-waves measured 27.6 mV.  Impedance 833 ohms.  Threshold  in the ventricle was 0.5 volts at 0.5 milliseconds.  Current was 0.6 mA.  The preformed atrial stylet was then placed in the atrial lead and the  atrial lead was then positioned in the right atrial appendage after  extensive mapping of the right atrium, we were finally able to get the P-  waves in the 5-6 mV.  The screw was then extended  and threshold  determined.  P-waves measured 3 mV.  Impedance 553 ohms.  Threshold in  the atrium was 0.8 volts at 0.5 milliseconds.  Current was 1.6 mA.  Four  silk sutures were then placed at the base of the retained guidewires  initially and anchored to the clavipectoral fascia.  Each lead was then  fastened to the pectoral fascia with two silk sutures per lead.  The  pocket was then copiously irrigated with 1% Kanamycin solution.  The  leads were then connected in a serial fashion to Medtronic Adapta  generator, model ADD401, serial number [id number].  Head screws were  tightened and pacing was confirmed.  A single silk suture was placed in  the supraxillary pocket.  The generator and leads were delivered into  the pocket and the header was secured with a silk suture.  The  subcutaneous layer was closed using 2-0 Vicryl.  The skin was closed  using 4-0  Vicryl.  Steri-Strips were applied.  The patient was then  transferred to the recovery room in stable condition.   CONCLUSIONS:  Successful implant of a Medtronic Adapta ADD401 generator,  serial number [id number], with active atrial and ventricular leads.      Darlin Priestly, MD  Electronically Signed     RHM/MEDQ  D:  07/26/2007  T:  07/26/2007  Job:  [id number]   cc:   Ritta Slot, MD  Redge Gainer Perini, M.D.  Cristy Hilts. Jacinto Halim, MD

## 2011-03-13 NOTE — Discharge Summary (Signed)
NAME:  David [name], David [name]                       ACCOUNT NO.:  [id number]   MEDICAL RECORD NO.:  [id number]                   PATIENT TYPE:  INP   LOCATION:  5150                                 FACILITY:  [facility]   PHYSICIAN:  David [name], M.D.                DATE OF BIRTH:  December 04, 1925   DATE OF ADMISSION:  08/23/2003  DATE OF DISCHARGE:  08/30/2003                                 DISCHARGE SUMMARY   DISCHARGE DIAGNOSES:  1. Moderate-sized left lower lobe distribution pulmonary embolism.  2. Recent open reduction internal fixation of a left hip fracture in August     of 2004.  3. Hemoptysis due to #1, resolving at the time of discharge.  4. Allergic rhinitis.   HISTORY OF PRESENT ILLNESS:  David [name] is a very pleasant 77 year old  gentleman with a fairly benign past medical history; however, he did suffer  a left hip fracture in August of 2004 and underwent open reduction internal  fixation. He was treated with anticoagulation for a few weeks and then this  was discontinued, and he was placed on aspirin. Recently, the patient did  have some swelling and pain in his left lower extremity. Furthermore, he had  been riding in his car on a few trips recently. He awoke at 4 a.m. on the  morning of admission with a sudden onset of chest pain which was pleuritic  in nature. He called our office and was advised to present to the emergency  room. There he was subsequently found to have a pulmonary embolism involving  his left lower lobe pulmonary arterial distribution. There was also some  sign of pulmonary infarct.   HOSPITAL COURSE:  David [name] was admitted to a telemetry bed. He was  placed on Lovenox and Coumadin therapy. He did have some slight hemoptysis  which resolved readily. He remained stable from a cardiovascular standpoint  and did not have significant hypoxia. He did have some low grade  temperatures initially which resolved without further measures. He was  somewhat  resistant to Coumadin and required higher doses to obtain an INR of  greater than 2.0. By August 30, 2003, he was therapeutic on his  anticoagulation, and he was discharged home to the care of his family.   DISCHARGE PHYSICAL EXAM:  VITAL SIGNS:  The patient was afebrile.  Temperature 98, pulse 50, respiratory rate 20, blood pressure 109/67, 96%  saturation on room air.  LUNGS:  Lung exam revealed essentially clear lungs bilaterally.  HEART:  Heart was regular rate and rhythm with no murmur.  ABDOMEN:  Abdomen was soft and benign.  EXTREMITIES:  There was no peripheral edema noted.   DISCHARGE LABORATORY DATA:  White count 7.9 with a normal differential,  hemoglobin 12.2, platelet count 261,000. INR 2.0. Sodium 139, potassium 4.2,  chloride 105, CO2 28, BUN 22, creatinine 1.3, glucose 95. ALT 45, albumin  2.8.   DISCHARGE INSTRUCTIONS:  David [name] was discharged on Coumadin therapy. He  was advised on Coumadin education. He is to followup promptly in the office  for frequent INR checks at our office. He is to followup with [place] in one week  for outpatient followup. He is to call if there are any questions or  concerns. He was also discharged on some p.r.n. Vicodin as well.                                                David [name], M.D.    MAP/MEDQ  D:  09/12/2003  T:  09/13/2003  Job:  [id number]

## 2011-03-13 NOTE — Consult Note (Signed)
NAME:  David [name], DELLAROCCO                       ACCOUNT NO.:  [id number]   MEDICAL RECORD NO.:  [id number]                   PATIENT TYPE:  EMS   LOCATION:  MINO                                 FACILITY:  [facility]   PHYSICIAN:  Tia Alert, MD                  DATE OF BIRTH:  1926/07/28   DATE OF CONSULTATION:  09/23/2003  DATE OF DISCHARGE:                                   CONSULTATION   CHIEF COMPLAINT:  Bilateral subdural hematoma.   HISTORY OF PRESENT ILLNESS:  Mr. Primeau is a 77 year old white male who  presented to the emergency department with a 3 day history of headaches with  associated nausea. He fell in August and broke his left hip and underwent  surgery for this. He then presented in late October with a pulmonary embolus  and was found to have a DVT with a pulmonary embolus on CT scan and has been  treated with Coumadin since that time. He went to CT scan, which showed  bilateral chronic subdural hematomas. His INR is 3.5 in the emergency  department. He received Dilaudid and Phenergan for the CT scan and now has  no headache but is quite lethargic from the medicines. His wife reports that  he has complained only of headache but no numbness, tingling or weakness.  There has been no change in his gait and no seizure activity. Neurosurgical  evaluation was requested.   PAST MEDICAL HISTORY:  As above. Also tonsillectomy.   MEDICATIONS:  Protonix 40 mg a day, Coumadin, and hydrocodone for pain.   ALLERGIES:  No known drug allergies.   SOCIAL HISTORY:  He does not smoke or drink. He is a retired Radiation protection practitioner. He is here with his wife.   PHYSICAL EXAMINATION:  VITAL SIGNS:  Blood pressure 169/88, pulse 41,  temperature 98.  GENERAL:  A very pleasant, cooperative white male in no acute distress. He  is somewhat lethargic but easily arousable.  HEENT:  Normocephalic. No evidence of external trauma. Atraumatic. Pupils  are equal and reactive. His gaze is  conjugant. Extraocular movements are  full.  NECK:  Supple.  HEART:  Regular rate and rhythm.  EXTREMITIES:  No clubbing, cyanosis, or edema.  NEUROLOGIC:  He is lethargic but easily arousable. He follows commands  briskly. He reports no headache at this time. There is no evidence of  aphasia. His attention span is quite good once aroused. He has no facial  asymmetry. His tongue protrudes midline and he can puff out his cheeks. He  has good shoulder shrug. His strength is 5 out of 5 throughout with good  muscle tone and bulk. There is no pronator drift. He has no clonus. His toes  are downgoing. His gait is not assessed. Sensation is grossly intact.   IMAGING STUDIES:  CT scan of the brain shows bilateral chronic subdural  hematoma with each measuring about 1  cm in greatest diameter. There is no  significant shift. There is some mass effect with flattening of the  gyral/sulcal pattern bilaterally. The basil cisterns are open. I do not see  any significant acute blood.   LABORATORY DATA:  His INR is 3.0. His hemoglobin is 12.   ASSESSMENT/PLAN:  This is a 77 year old white male who has bilateral chronic  subdural hematomas. These have likely been there for quite some time and  unrelated to the Coumadin that he is on now and they may be related to his  fall in August, as these tend to grow quite slowly. He has minimal symptoms.  Only has headaches and no neurologic deficit at this time. I think that we  can slowly reverse his coagulopathy with FFP and vitamin K. Will put him in  the Intensive Care Unit or step-down unit in the meantime for neurologic  checks. We will not put him on Dilantin at this time but will wait until  these are treated and then will likely start him on Dilantin for seizure  prophylaxis. Once his coagulopathy is reversed, will likely treat him with  either bilateral twist drill craniostomies at the bedside or burr holes in  the operating room. He will need a  Greenfield filter in the meantime, since  we have to reverse his coagulopathy and had a recent pulmonary embolus.                                               Tia Alert, MD    DSJ/MEDQ  D:  09/23/2003  T:  09/23/2003  Job:  [id number]

## 2011-03-13 NOTE — H&P (Signed)
NAME:  David [name], David [name]                       ACCOUNT NO.:  [id number]   MEDICAL RECORD NO.:  [id number]                   PATIENT TYPE:  EMS   LOCATION:  MINO                                 FACILITY:  [facility]   PHYSICIAN:  Geoffry Paradise, M.D.               DATE OF BIRTH:  19-Jul-1926   DATE OF ADMISSION:  08/23/2003  DATE OF DISCHARGE:                                HISTORY & PHYSICAL   CHIEF COMPLAINT:  Left chest pain.   HISTORY OF PRESENT ILLNESS:  The patient is a very-pleasant 77 year old  gentleman without significant past medical history beyond his recent hip  fracture, presenting at this time with pleuritic left-sided chest pain.  He  has had no significant past medical history, but did sustain while out of  town in [place], a fall resulting in a left femoral fracture.  He was  hospitalized at [facility] down towards [facility] where he  underwent repair.  Between his inpatient stay and a short inpatient  rehabilitation stay, the patient received approximately 10-14 days total of  anticoagulation.  He was subsequently discharged home on aspirin alone, and  has been in outpatient rehabilitation since that time.  He relates yesterday  feeling a bit achy, but nonspecific.  He awakened at 4 a.m. this morning  with sudden onset of left chest pain which is pleuritic in nature.  He has  had no cough or sputum, no fever, chills or sweats, no nausea or vomiting.  He has had some left lower extremity swelling which he relates present since  surgery and unchanged.   In the emergency room, chest x-ray was obtained, felt to be compatible with  atelectasis and/or pneumonia, and he was given empirically Rocephin.   PAST MEDICAL HISTORY:  No known drug allergies.  He is allergic to bee  stings.   MEDICATIONS:  Aspirin and multivitamin.   MEDICAL ILLNESSES:  None.   SURGICAL ILLNESSES:  Remote tonsillectomy and adenoidectomy, remote left  humeral fracture, and  recent left hip fracture in August of 2004.   FAMILY HISTORY:  Noncontributory.   SOCIAL HISTORY:  The patient does not smoke or drink.  He is a retired [employer].  He is accompanied by his wife.   PHYSICAL EXAMINATION:  VITAL SIGNS:  Temperature 97, blood pressure 160/80,  pulse 77, respiratory rate 18, O2 saturation 95%.  GENERAL:  The patient is pleasant, splinting a bit, holding his left rib  cage; otherwise, in no distress with good coloring.  HEENT:  Normocephalic, atraumatic individual, anicteric.  Oropharynx benign.  NECK:  Supple, no JVD or bruits.  Negative pulses paradox.  LUNGS:  Clear.  Diminished breath sounds left base, no rales or rhonchi.  CARDIOVASCULAR:  Regular rate and rhythm, no murmur, gallop, rub or heave.  CHEST:  Chest wall is negative.  Breast exam is negative.  ABDOMEN:  Soft, nontender, no hepatosplenomegaly.  Bowel sounds are  normal.  EXTREMITIES:  Reveal left hip to be healing well. Right lower extremity  unremarkable with intact distal pulses.  Calf soft and nontender.  Left  lower extremity 1 to 2+ edema.  Minimal calf tenderness, questionable  Homan's sign.  Intact distal pulses.  GU/RECTAL:  Exam not indicated or done.  NEUROLOGIC:  On exam, non-lateralizing.   LABORATORY DATA:  Chest x-ray reveals atelectasis, questionable infiltrate.  CBC, hemoglobin 12.6, white blood cell count 9.6, fibrin derivatives  elevated at 1.33.  BMET normal.  Creatinine specifically 1.1.  BNP 137.   ASSESSMENT:  1. Pleuritic left-sided chest pain.  ER initiated treatment for pneumonia,     but clinical picture most compatible with thromboembolic disease,     specifically probable pulmonary embolus and/or left lower extremity deep     vein thrombosis.  2. Left-hip fracture healing well in rehabilitation./   PLAN:  A stat CT of the chest with lower-extremity venous exam to rule out  thromboembolic disease with further decision regarding antibiotics and/or   anticoagulation pending this.  We will empirically given him a dose of  Lovenox pending the scan, further pending these results.                                                Geoffry Paradise, M.D.    RA/MEDQ  D:  08/23/2003  T:  08/23/2003  Job:  [id number]

## 2011-03-13 NOTE — Op Note (Signed)
NAMEMarland Kitchen  JERZY, ROEPKE                       ACCOUNT NO.:  [id number]   MEDICAL RECORD NO.:  [id number]                   PATIENT TYPE:  INP   LOCATION:  2107                                 FACILITY:  [facility]   PHYSICIAN:  Tia Alert, MD                  DATE OF BIRTH:  1926/08/21   DATE OF PROCEDURE:  09/25/2003  DATE OF DISCHARGE:                                 OPERATIVE REPORT   PRE-PROCEDURE DIAGNOSIS:  Bilateral chronic subdural hematomas.   POST-PROCEDURE DIAGNOSIS:  Bilateral chronic subdural hematomas.   PROCEDURE:  Bilateral frontal twist-drill craniostomies with subdural drain  placement for evacuation of subdural hematomas.   SURGEON:  Tia Alert, M.D.   ANESTHESIA:  Local   COMPLICATIONS:  None apparent.   INDICATION FOR THE PROCEDURE:  Mr. Demuro is a 77 year old white male who  fell and broke his hip about three months ago and then had a pulmonary  embolus and was put on Coumadin four weeks ago.  He presented to the  emergency department with headaches and was found to have bilateral chronic  subdural hematomas on CT scan, each measuring about a centimeter in width.  We recommended bilateral twist-drill craniostomies and subdural drain  placement and a planned staged procedure to treat these chronic subdural  hematomas.  The patient understood the risks, the benefits and the  alternatives and wished to proceed.   DESCRIPTION OF THE PROCEDURE:  The patient was in the supine position in the  ICU bed.  His left frontal region was prepped with Betadine and then draped  in the usual sterile fashion.  Three milliliters of local anesthesia were  injected and a stab incision was made in the left frontal region and then a  twist-drill craniostomy was performed with the hand drill.  A subdural drain  was then placed without use of a trocar into the subdural space with good  flow of reddish-tinted fluid.  The drain was then tunneled through a  separate stab  incision and sewn into position and an occlusive dressing was  placed.  We then turned our attention to the right side and prepped the  right frontal region with Betadine, draped this in the usual sterile  fashion, used 3 mL of local anesthesia, made another stab incision and  performed another twist-drill craniostomy on the right side and a separate  drill was placed without a trocar in the same fashion with good flow of  reddish-tinted fluid.  This was then tunneled and sewn into position and an  occlusive dressing was placed.  The patient tolerated these procedures well  and a CT scan was ordered.  Tia Alert, MD    DSJ/MEDQ  D:  09/25/2003  T:  09/25/2003  Job:  [id number]

## 2011-03-13 NOTE — Consult Note (Signed)
NAMEMarland Kitchen  SANAV, REMER                       ACCOUNT NO.:  [id number]   MEDICAL RECORD NO.:  [id number]                   PATIENT TYPE:  INP   LOCATION:  2107                                 FACILITY:  [facility]   PHYSICIAN:  Jesse Sans. Wall, M.D.                DATE OF BIRTH:  1925-10-29   DATE OF CONSULTATION:  09/24/2003  DATE OF DISCHARGE:                                   CONSULTATION   REASON FOR CONSULTATION:  Bradycardia.   HISTORY OF PRESENT ILLNESS:  A 77 year old male who fell off a ladder in  August 2004 and broke his hip.  The patient states that he was about 6 feet  up on the ladder working on a tree when the ladder slipped on some branches,  twisted, and subsequently led to his fall.  The patient denied any symptoms  prior to this, no lightheadedness, dizziness, syncope, presyncope, chest  pain, or shortness of breath.  The patient states that the ladder simply  slipped, and he subsequently fell and broke his hip.  The patient underwent  subsequent ORIF and began his rehabilitation showing some improvement in his  functional status.  His medical history was complicated by development of a  left lower lobe pulmonary embolism (October 2004) treated with Coumadin.  Again, the patient appeared to be doing well until earlier this month when  patient developed headache and nausea for which he reported to the emergency  room and subsequently was found to have bilateral subacute subdural  hematomas with an INR of 3 on Coumadin.  Neurosurgery was consulted at that  time with recommendations for reversal of anticoagulation with FFP and  vitamin K.  The patient's neurologic exam at that point was nonfocal;  therefore, the patient was treated conservatively by moving patient to the  MICU for observation while anticoagulation was reversed.  The patient had an  IVC filter placed on September 23, 2003, for prophylaxis of future pulmonary  emboli.  The patient is currently being admitted  for bilateral craniotomies  with INR normalized and possible empiric Dilantin therapy.  Cardiac consult  was called tonight for sinus bradycardia in the 30s and 40s and occasional  sinus pauses up to 2.6 seconds.  The patient has been asymptomatic and  hemodynamically stable with these heart rates.  When questioned, the patient  states that his heart rate has always been slow (i.e. 40s).  The patient  again denies any history of dizziness, syncope, or previous dyspnea on  exertion.   PAST MEDICAL HISTORY:  1. Fall from ladder resulting from unstable ladder position and subsequent     hip fracture with ORIF (August 2004).  2. Left lower lobe pulmonary embolism with hemoptysis, treated with Coumadin     (October 2004).  3. Presentation with headache and nausea with subsequent finding of     bilateral subacute subdural hematomas as per HPI (November 2004).  4. Allergic rhinitis.  5. History of gout.  6. History of BEE STING allergy.   FAMILY HISTORY:  Father died of a myocardial infarction at age 79.  Mother  died of a stroke at age 31.   SOCIAL HISTORY:  No tobacco and no alcohol, no drugs.  The patient is  married and formerly worked as a Nature conservation officer.   HOME MEDICATIONS:  1. Vicodin p.r.n.  2. Colchicine 0.6 mg b.i.d. p.r.n.  3. Protonix 40 mg p.o. daily.  4. Coumadin 2.5 mg p.o. daily.  5. Fish oil/calcium.   CURRENT HOSPITAL MEDICATIONS:  P.R.N. morphine and Vicodin only.   ALLERGIES:  No known drug allergies.   PHYSICAL EXAMINATION:  VITAL SIGNS:  Temperature 98.9, blood pressure  130/50, heart rate 52, respiratory rate 14 on 3 litters nasal cannula.  GENERAL:  The patient is alert and appropriate.  He is pleasant,  cooperative, and answers questions in an appropriate fashion.  HEENT:  Normocephalic and atraumatic.  Extraocular movements are intact.  NECK:  Supple with no evidence of bruits.  LUNGS:  His lung fields are clear bilaterally.  CARDIAC:  Bradycardia  with heart rate in the 40s with normal S1, normal S2,  and a faint systolic murmur.  ABDOMEN:  Mildly obese.  Soft, nontender, nondistended, with positive bowel  sounds  VASCULAR:  2+ femoral pulses with no bruits.  Carotid examination reveals 2+  pulses bilaterally with no bruits. Distal extremity pulses are 2+ and  symmetric bilaterally.  EXTREMITIES:  Lower extremity examination reveals approximately 1 to 2+  pedal lower extremity edema.   LABORATORY DATA:  EKG on September 24, 2003, 01:56 revealed sinus rhythm at a  rate of 41 with first degree A-V block.   Rhythm strips (November 29):  Sinus rhythm in the 40s with pauses of up to  2.6 seconds noted on two individual rhythm strips, also atrial ectopic beats  and occasional PACs are also noted.   Laboratory values from September 23, 2003, showed sodium 139, potassium 3.7,  chloride 106, bicarb 26, BUN 10, creatinine 1.1, glucose 109, calcium 9.1,  INR 3.0.  White blood cell count 6.5, hematocrit 35, platelet count 118.   Head CT (September 23, 2003): Bilateral subacute subdural hematomas with  focal effacement but no obvious midline shift.   Chest CT (August 23, 2003): Left lower lobe pulmonary embolism and possible  pulmonary infarct.  Changes also consistent with COPD and absence of DVT.   REVIEW OF SYSTEMS:  Essentially negative other than systems attributable to  HPI.  The patient has had no recent alternation in his auditory or visual  acuity, no recent weight loss or weight gain.  He has had no change in bowel  or bladder habits.  He has had no acute rashes.  Review of Systems otherwise  negative.   ASSESSMENT AND PLAN:  1. Sinus bradycardia with heart rate in the 30s and 40s when patient is     resting with subsequent increased heart rate into the 50s when he engages     in conversation, evidence of first degree arteriovenous block on 12-lead    electrocardiogram,  Occasional sinus pauses on telemetry up to 2.6      seconds.  Recommend that patient avoid agents that would slow heart rate     (for example, calcium channel blockers and/or beta blockers).  2. Suspect patient's heart rhythm is at his baseline.  It is possible that     increased intracranial pressure could lead to bradycardia; however,  bradycardia related to increased intracranial pressure is usually     accompanied by hypertension which this patient does not have.  If patient     indeed does have bradycardia that is related to or worsened by increased     intracranial pressure, then his bradycardia should be improved with     future drainage of subdural hematomas as planned by neurosurgery.  3. There is mention in neurosurgery consult note of empiric use of Dilantin     as an antiepileptic.  This should be used with caution in light of its     potential effect on the cardiac conduction system.  If possible, avoiding     Dilantin would be preferred.  If antiepileptic therapy is required,     perhaps an agent with less potential for cardiac side effects could be     chosen (for example, Keppra, although this discussion could be carried     out further with neurology/neurosurgery and/or pharmacy).  4. If evidence of worsening bradycardia accompanied by signs of     hypoperfusion, could heparinize with low-dose dopamine drip and     consideration of pacemaker placement.  5. The patient's fall from the ladder was not preceded by dizziness,     syncope, or presyncopal symptoms; therefore, I do not think that     patient's heart rate was a contributor to his fall from the ladder as     determined by review of patient's history.  The patient describes no     other exertional limitations, syncope, or presyncope prior to his recent     multiple hospitalizations.  Therefore, I do not think that patient's     chronic bradycardia (baseline heart rate in the 40s per patient) is in     need of any further intervention at this time.     Verne Grain, MD                      Jesse Sans Wall, M.D.    DDH/MEDQ  D:  09/24/2003  T:  09/24/2003  Job:  [id number]

## 2011-03-13 NOTE — Discharge Summary (Signed)
NAME:  David [name], David [name]                       ACCOUNT NO.:  [id number]   MEDICAL RECORD NO.:  [id number]                   PATIENT TYPE:  INP   LOCATION:  3707                                 FACILITY:  [facility]   PHYSICIAN:  Mark A. Perini, M.D.                DATE OF BIRTH:  October 17, 1926   DATE OF ADMISSION:  09/23/2003  DATE OF DISCHARGE:  09/29/2003                                 DISCHARGE SUMMARY   DISCHARGE DIAGNOSES:  1. Bilateral subdural hematomas which required drainage per neurosurgery.  2. Recent history of pulmonary embolism which occurred after a hip fracture.     The patient only had had four weeks of Coumadin therapy but this was     discontinued given his new subdural hematomas.  3. Allergic rhinitis.  4. Gout.   PROCEDURES:  1. Patient had an inferior vena cava filter placed on September 23, 2003.  2. He had multiple CT scans of his head.  Admission CT showed bilateral     subacute frontoparietal subdural hematomas measuring up to 1 cm in     thickness with associated sulcal effacement.  3. The patient underwent bedside drainage of his hematomas.  This was done     on September 25, 2003, with bilateral frontal twist drill craniotomies     with subdural drain placement.  4. He had a bilateral lower extremity Doppler ultrasound of the veins which     showed no evidence of DVT.  This was performed on September 24, 2003.   DISCHARGE MEDICATIONS:  1. The patient is to avoid aspirin and Coumadin.  2. Colchicine 0.6 mg one daily.  3. Protonix 40 mg one daily.   HISTORY OF PRESENT ILLNESS:  David [name] is a very pleasant 77 year old  gentleman with a recent pulmonary embolism following a hip fracture.  He had  been treated with Coumadin for one month.  His INR had been slightly  supratherapeutic but this had been down adjusted.  The patient developed a  severe headache and presented to the emergency room and was found to have  bilateral subdural hematomas.  These were  felt to be chronic or subacute in  nature.  There was no definite fall or injury related to these that we could  discern.  The patient was admitted to the intensive care unit for further  care.   HOSPITAL COURSE:  David [name] did have an inferior vena cava filter placed  since he will not be able to be treated with anticoagulation for the  indefinite future.  Neurosurgery was consulted and greatly assisted in his  care.  This did require drainage of the subdural hematomas on September 25, 2003.  David [name] remained stable during his stay.  He had no permanent  neurologic deficit noted.  He had no evidence of DVT.  He was maintained on  bilateral PAS hose throughout his entire hospital stay.  He did  have some  bradycardia and cardiology consultation was obtained.  This was felt to be  due to his subdural hematomas and had actually improved as his hospital  course progressed.  No interventions were performed.   By September 29, 2003, David [name] was deemed stable for discharge home with  outpatient follow-up.   DISCHARGE PHYSICAL EXAMINATION:  VITAL SIGNS:  Temperature 99.3, blood  pressure was stable.  LUNGS:  Clear to auscultation bilaterally.  CARDIOVASCULAR:  Regular rate and rhythm with no murmurs, rubs, or gallops.  ABDOMEN:  Benign.  EXTREMITIES:  There was no edema.   LABORATORY DATA:  Notable discharge laboratory data shows INR was 1.0.  On  September 27, 2003, white count was 9.6, hemoglobin 12.8, hematocrit 36.0,  platelet count 143,000.  On September 27, 2003, sodium was 139, potassium 3.9,  chloride 102, CO2 29, BUN 17, creatinine 1.2, glucose 95, calcium 8.9.   DISCHARGE INSTRUCTIONS:  1. David [name] is to be up as tolerated but he is to use his walker for the     first few days.  2. He is to wear TED hose daily while awake.  3. He is to call for a follow-up appointment with Dr. Yetta Barre.  4. He is to have a 10-day follow-up head CT scan.  5. He is to call our office for  follow-up in the next week or two with     myself, Dr. Waynard Edwards.                                                Mark A. Waynard Edwards, M.D.    MAP/MEDQ  D:  10/08/2003  T:  10/09/2003  Job:  [id number]   cc:   Tia Alert, MD  Fax: [phone]   Charlton Haws, M.D.

## 2011-08-06 LAB — PROTIME-INR: Prothrombin Time: 14.8

## 2013-01-04 ENCOUNTER — Encounter (HOSPITAL_BASED_OUTPATIENT_CLINIC_OR_DEPARTMENT_OTHER): Payer: Medicare Other | Attending: General Surgery

## 2013-01-04 DIAGNOSIS — L97509 Non-pressure chronic ulcer of other part of unspecified foot with unspecified severity: Secondary | ICD-10-CM | POA: Insufficient documentation

## 2013-01-04 DIAGNOSIS — Z7901 Long term (current) use of anticoagulants: Secondary | ICD-10-CM | POA: Insufficient documentation

## 2013-01-04 DIAGNOSIS — Z950 Presence of cardiac pacemaker: Secondary | ICD-10-CM | POA: Insufficient documentation

## 2013-01-04 DIAGNOSIS — I498 Other specified cardiac arrhythmias: Secondary | ICD-10-CM | POA: Insufficient documentation

## 2013-01-05 NOTE — Progress Notes (Signed)
[facility]  NAME:  [name], David [name] NO.:  [id number]  MEDICAL RECORD NO.:  [id number]      DATE OF BIRTH:  November 21, 1925  PHYSICIAN:  Ardath Sax, M.D.           VISIT DATE:                                  OFFICE VISIT   This is an 79 year old male who comes here because of a reported ulcer on the side of his left foot.  He is a patient who has a history of heart disease.  He has a pacemaker.  He has a history of sinus bradycardia.  He also has a history of subdural hematomas and he has inferior vena cava filter.  He is on Coumadin and apparently he got some cellulitis of his foot, which resulted in some drainage from a small superficial ulcer that I would say probably is venous stasis type ulcer, and today when I saw him I just really washed it and put some Silvadene ointment on it.  I do not think this is going to be very serious at all. It was only 3-4 mm in diameter, and looked like it would nicely heal. His blood pressure is 123/77, respirations 16, pulse 88, temperature 97.8.  He is a very alert gentleman who I talked to for quite a while and I advised him to use this ointment that I gave him, and to come back p.r.n.     Ardath Sax, M.D.     PP/MEDQ  D:  01/04/2013  T:  01/05/2013  Job:  [id number]

## 2013-05-31 ENCOUNTER — Encounter: Payer: Self-pay | Admitting: Gastroenterology

## 2013-07-05 ENCOUNTER — Encounter: Payer: Self-pay | Admitting: *Deleted

## 2013-07-21 ENCOUNTER — Encounter: Payer: Self-pay | Admitting: *Deleted

## 2013-07-27 ENCOUNTER — Other Ambulatory Visit: Payer: Self-pay | Admitting: Cardiovascular Disease

## 2013-07-27 ENCOUNTER — Ambulatory Visit (INDEPENDENT_AMBULATORY_CARE_PROVIDER_SITE_OTHER): Payer: Medicare Other | Admitting: *Deleted

## 2013-07-27 DIAGNOSIS — I495 Sick sinus syndrome: Secondary | ICD-10-CM

## 2013-07-27 LAB — PACEMAKER DEVICE OBSERVATION

## 2013-08-04 ENCOUNTER — Encounter: Payer: Self-pay | Admitting: *Deleted

## 2013-08-04 LAB — REMOTE PACEMAKER DEVICE
ATRIAL PACING PM: 90
BAMS-0001: 165 {beats}/min
RV LEAD IMPEDENCE PM: 529 Ohm
VENTRICULAR PACING PM: 0

## 2013-10-30 ENCOUNTER — Ambulatory Visit (INDEPENDENT_AMBULATORY_CARE_PROVIDER_SITE_OTHER): Payer: Medicare Other | Admitting: *Deleted

## 2013-10-30 ENCOUNTER — Ambulatory Visit: Payer: Medicare Other

## 2013-10-30 DIAGNOSIS — I495 Sick sinus syndrome: Secondary | ICD-10-CM

## 2013-10-30 LAB — PACEMAKER DEVICE OBSERVATION

## 2013-10-31 LAB — MDC_IDC_ENUM_SESS_TYPE_REMOTE
Battery Remaining Longevity: 33 mo
Battery Voltage: 2.74 V
Brady Statistic AP VP Percent: 0 %
Brady Statistic AS VP Percent: 0 %
Lead Channel Impedance Value: 432 Ohm
Lead Channel Pacing Threshold Amplitude: 0.375 V
Lead Channel Setting Pacing Amplitude: 1.5 V
Lead Channel Setting Pacing Pulse Width: 0.4 ms
MDC IDC MSMT BATTERY IMPEDANCE: 1614 Ohm
MDC IDC MSMT LEADCHNL RA PACING THRESHOLD PULSEWIDTH: 0.4 ms
MDC IDC MSMT LEADCHNL RV IMPEDANCE VALUE: 521 Ohm
MDC IDC MSMT LEADCHNL RV PACING THRESHOLD AMPLITUDE: 0.75 V
MDC IDC MSMT LEADCHNL RV PACING THRESHOLD PULSEWIDTH: 0.4 ms
MDC IDC MSMT LEADCHNL RV SENSING INTR AMPL: 22.4 mV
MDC IDC SESS DTM: 20150106001521
MDC IDC SET LEADCHNL RV PACING AMPLITUDE: 2 V
MDC IDC SET LEADCHNL RV SENSING SENSITIVITY: 5.6 mV
MDC IDC STAT BRADY AP VS PERCENT: 89 %
MDC IDC STAT BRADY AS VS PERCENT: 10 %

## 2013-11-12 ENCOUNTER — Encounter: Payer: Self-pay | Admitting: *Deleted

## 2014-01-31 ENCOUNTER — Ambulatory Visit (INDEPENDENT_AMBULATORY_CARE_PROVIDER_SITE_OTHER): Payer: 59 | Admitting: *Deleted

## 2014-01-31 ENCOUNTER — Encounter: Payer: Self-pay | Admitting: Cardiovascular Disease

## 2014-01-31 DIAGNOSIS — I495 Sick sinus syndrome: Secondary | ICD-10-CM

## 2014-02-06 LAB — MDC_IDC_ENUM_SESS_TYPE_REMOTE
Battery Impedance: 1795 Ohm
Battery Voltage: 2.72 V
Brady Statistic AP VS Percent: 89 %
Brady Statistic AS VS Percent: 10 %
Lead Channel Pacing Threshold Amplitude: 0.375 V
Lead Channel Pacing Threshold Amplitude: 0.875 V
Lead Channel Pacing Threshold Pulse Width: 0.4 ms
Lead Channel Pacing Threshold Pulse Width: 0.4 ms
Lead Channel Setting Pacing Amplitude: 2 V
Lead Channel Setting Pacing Pulse Width: 0.4 ms
Lead Channel Setting Sensing Sensitivity: 5.6 mV
MDC IDC MSMT BATTERY REMAINING LONGEVITY: 30 mo
MDC IDC MSMT LEADCHNL RA IMPEDANCE VALUE: 461 Ohm
MDC IDC MSMT LEADCHNL RV IMPEDANCE VALUE: 536 Ohm
MDC IDC MSMT LEADCHNL RV SENSING INTR AMPL: 16 mV
MDC IDC SESS DTM: 20150410195208
MDC IDC SET LEADCHNL RA PACING AMPLITUDE: 1.5 V
MDC IDC STAT BRADY AP VP PERCENT: 0 %
MDC IDC STAT BRADY AS VP PERCENT: 0 %

## 2014-02-16 ENCOUNTER — Encounter: Payer: Self-pay | Admitting: *Deleted

## 2014-05-07 ENCOUNTER — Ambulatory Visit (INDEPENDENT_AMBULATORY_CARE_PROVIDER_SITE_OTHER): Payer: Medicare Other | Admitting: *Deleted

## 2014-05-07 DIAGNOSIS — I495 Sick sinus syndrome: Secondary | ICD-10-CM

## 2014-05-08 NOTE — Progress Notes (Signed)
Remote pacemaker transmission.   

## 2014-05-09 LAB — MDC_IDC_ENUM_SESS_TYPE_REMOTE
Battery Impedance: 2012 Ohm
Battery Voltage: 2.73 V
Brady Statistic AP VP Percent: 0 %
Brady Statistic AP VS Percent: 89 %
Brady Statistic AS VS Percent: 10 %
Date Time Interrogation Session: 20150714122311
Lead Channel Impedance Value: 428 Ohm
Lead Channel Impedance Value: 494 Ohm
Lead Channel Pacing Threshold Pulse Width: 0.4 ms
Lead Channel Sensing Intrinsic Amplitude: 16 mV
Lead Channel Setting Pacing Amplitude: 1.5 V
Lead Channel Setting Pacing Amplitude: 2 V
MDC IDC MSMT BATTERY REMAINING LONGEVITY: 26 mo
MDC IDC MSMT LEADCHNL RA PACING THRESHOLD AMPLITUDE: 0.5 V
MDC IDC MSMT LEADCHNL RV PACING THRESHOLD AMPLITUDE: 0.875 V
MDC IDC MSMT LEADCHNL RV PACING THRESHOLD PULSEWIDTH: 0.4 ms
MDC IDC SET LEADCHNL RV PACING PULSEWIDTH: 0.4 ms
MDC IDC SET LEADCHNL RV SENSING SENSITIVITY: 5.6 mV
MDC IDC STAT BRADY AS VP PERCENT: 0 %

## 2014-05-15 ENCOUNTER — Encounter: Payer: Self-pay | Admitting: Cardiology

## 2014-05-31 ENCOUNTER — Encounter: Payer: Self-pay | Admitting: Cardiovascular Disease

## 2014-08-08 ENCOUNTER — Other Ambulatory Visit: Payer: Self-pay | Admitting: Dermatology

## 2014-09-25 ENCOUNTER — Encounter: Payer: Self-pay | Admitting: Cardiovascular Disease

## 2014-09-25 ENCOUNTER — Ambulatory Visit (INDEPENDENT_AMBULATORY_CARE_PROVIDER_SITE_OTHER): Payer: Medicare Other | Admitting: Cardiovascular Disease

## 2014-09-25 VITALS — BP 108/60 | HR 90 | Resp 16 | Ht 71.0 in | Wt 193.3 lb

## 2014-09-25 DIAGNOSIS — Z950 Presence of cardiac pacemaker: Secondary | ICD-10-CM | POA: Insufficient documentation

## 2014-09-25 DIAGNOSIS — I472 Ventricular tachycardia: Secondary | ICD-10-CM

## 2014-09-25 DIAGNOSIS — Z9889 Other specified postprocedural states: Secondary | ICD-10-CM

## 2014-09-25 DIAGNOSIS — Z95828 Presence of other vascular implants and grafts: Secondary | ICD-10-CM

## 2014-09-25 DIAGNOSIS — I4729 Other ventricular tachycardia: Secondary | ICD-10-CM | POA: Insufficient documentation

## 2014-09-25 DIAGNOSIS — I495 Sick sinus syndrome: Secondary | ICD-10-CM

## 2014-09-25 DIAGNOSIS — Z8672 Personal history of thrombophlebitis: Secondary | ICD-10-CM | POA: Insufficient documentation

## 2014-09-25 DIAGNOSIS — I471 Supraventricular tachycardia: Secondary | ICD-10-CM | POA: Insufficient documentation

## 2014-09-25 LAB — MDC_IDC_ENUM_SESS_TYPE_INCLINIC
Brady Statistic AP VP Percent: 0 %
Brady Statistic AP VS Percent: 89 %
Brady Statistic AS VP Percent: 0 %
Date Time Interrogation Session: 20151201145647
Lead Channel Impedance Value: 508 Ohm
Lead Channel Pacing Threshold Amplitude: 0.5 V
Lead Channel Pacing Threshold Amplitude: 0.75 V
Lead Channel Pacing Threshold Pulse Width: 0.4 ms
Lead Channel Sensing Intrinsic Amplitude: 2.8 mV
Lead Channel Sensing Intrinsic Amplitude: 22.4 mV
MDC IDC MSMT BATTERY IMPEDANCE: 2171 Ohm
MDC IDC MSMT BATTERY REMAINING LONGEVITY: 24 mo
MDC IDC MSMT BATTERY VOLTAGE: 2.72 V
MDC IDC MSMT LEADCHNL RA IMPEDANCE VALUE: 430 Ohm
MDC IDC MSMT LEADCHNL RV PACING THRESHOLD PULSEWIDTH: 0.4 ms
MDC IDC SET LEADCHNL RA PACING AMPLITUDE: 1.5 V
MDC IDC SET LEADCHNL RV PACING AMPLITUDE: 2 V
MDC IDC SET LEADCHNL RV PACING PULSEWIDTH: 0.4 ms
MDC IDC SET LEADCHNL RV SENSING SENSITIVITY: 5.6 mV
MDC IDC STAT BRADY AS VS PERCENT: 11 %

## 2014-09-25 NOTE — Patient Instructions (Addendum)
Remote monitoring is used to monitor your Pacemaker of ICD from home. This monitoring reduces the number of office visits required to check your device to one time per year. It allows [place] to keep an eye on the functioning of your device to ensure it is working properly. You are scheduled for a device check from home on 3 Months. You may send your transmission at any time that day. If you have a wireless device, the transmission will be sent automatically. After your physician reviews your transmission, you will receive a postcard with your next transmission date.  Your physician wants you to follow-up in: 1 Year You will receive a reminder letter in the mail two months in advance. If you don't receive a letter, please call our office to schedule the follow-up appointment.

## 2014-09-26 ENCOUNTER — Encounter: Payer: Self-pay | Admitting: Cardiovascular Disease

## 2014-09-26 NOTE — Progress Notes (Signed)
Patient ID: David [name], male   DOB: [birth year], 80 y.o.   MRN: [id number]      Reason for office visit Pacemaker followup  Normal dual chamber pacemaker function (Medtronic Adapta 2008, implanted for sinus node dysfunction) with only 6% atrial pacing and 2.4% ventricular pacing. >13 years generator longevity, excellent lead parameters. Occasional episodes of brief paroxysmal atrial tachycardia, brief nonsustained ventricular tachycardia, longest 6 seconds, none symptomatic. He has a history of previous subdural hematoma while on warfarin therapy for post-knee replacement pulmonary embolism. Has an IVC filter and is on warfarin therapy. History of gout and hyperlipidemia without significant structural heart disease.   No Known Allergies  Current Outpatient Prescriptions  Medication Sig Dispense Refill  . alendronate (FOSAMAX) 70 MG tablet Take 1 tablet by mouth once a week.  1  . alfuzosin (UROXATRAL) 10 MG 24 hr tablet Take 10 mg by mouth every other day.  0  . allopurinol (ZYLOPRIM) 100 MG tablet Take 1 tablet by mouth daily.  0  . Calcium-Vitamin D-Vitamin K (CALCIUM + D + K PO) Take 500 mg by mouth daily.    . Cholecalciferol (VITAMIN D) 2000 UNITS tablet Take 2,000 Units by mouth daily.    . Coenzyme Q10 (CO Q-10) 100 MG CAPS Take 1 capsule by mouth daily.    Marland Kitchen EPINEPHrine 0.3 mg/0.3 mL IJ SOAJ injection Inject into the muscle as needed.    . hydrocodone-acetaminophen (LORCET-HD) 5-500 MG per capsule Take 1 capsule by mouth as needed for pain.    . Multiple Vitamins-Minerals (PRESERVISION AREDS) CAPS Take 2 capsules by mouth daily.    Marland Kitchen SALINE NASAL SPRAY NA Place into the nose as needed.    . simvastatin (ZOCOR) 40 MG tablet Take 40 mg by mouth daily.  1  . warfarin (COUMADIN) 5 MG tablet Take 1 tablet by mouth daily. AS PER INR  0   No current facility-administered medications for this visit.    Past Medical History  Diagnosis Date  . Pacemaker 2008  . Paroxysmal atrial  fibrillation   . Sinus node dysfunction   . Venous thrombosis     following a hip fracture and surgery. 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 Dyslipidemia   . Pulmonary embolism     following a hip fracture and surgery. 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 Nonsustained ventricular tachycardia   . Osteoporosis     Severe    Past Surgical History  Procedure Laterality Date  . Pacemaker insertion      Medtronic Adapta model #ADDRO1, serial [id number]    No family history on file.  History   Social History  . Marital Status: Married    Spouse Name: N/A    Number of Children: N/A  . Years of Education: N/A   Occupational History  . Not on file.   Social History Main Topics  . Smoking status: Former Smoker    Quit date: 09/25/1984  . Smokeless tobacco: Not on file  . Alcohol Use: Yes  . Drug Use: No  . Sexual Activity: Not on file   Other Topics Concern  . Not on file   Social History Narrative    Review of systems: The patient specifically denies any chest pain at rest or with exertion, dyspnea at rest or with exertion, orthopnea, paroxysmal nocturnal dyspnea, syncope, palpitations, focal neurological deficits, intermittent claudication, lower extremity edema,  unexplained weight gain, cough, hemoptysis or wheezing.  The patient also denies abdominal pain, nausea, vomiting, dysphagia, diarrhea, constipation, polyuria, polydipsia, dysuria, hematuria, frequency, urgency, abnormal bleeding or bruising, fever, chills, unexpected weight changes, mood swings, change in skin or hair texture, change in voice quality, auditory or visual problems, allergic reactions or rashes, new musculoskeletal complaints other than usual "aches and pains".   PHYSICAL EXAM BP 108/60 mmHg  Pulse 90  Resp 16  Ht 5\' 11"  (1.803 m)  Wt 193 lb 4.8 oz (87.68 kg)  BMI 26.97 kg/m2  General: Alert,  oriented x3, no distress Head: no evidence of trauma, PERRL, EOMI, no exophtalmos or lid lag, no myxedema, no xanthelasma; normal ears, nose and oropharynx Neck: normal jugular venous pulsations and no hepatojugular reflux; brisk carotid pulses without delay and no carotid bruits Chest: clear to auscultation, no signs of consolidation by percussion or palpation, normal fremitus, symmetrical and full respiratory excursions, normal pacemaker site Cardiovascular: normal position and quality of the apical impulse, regular rhythm, normal first and second heart sounds, no murmurs, rubs or gallops Abdomen: no tenderness or distention, no masses by palpation, no abnormal pulsatility or arterial bruits, normal bowel sounds, no hepatosplenomegaly Extremities: no clubbing, cyanosis or edema; 2+ radial, ulnar and brachial pulses bilaterally; 2+ right femoral, posterior tibial and dorsalis pedis pulses; 2+ left femoral, posterior tibial and dorsalis pedis pulses; no subclavian or femoral bruits Neurological: grossly nonfocal   EKG: Apaced V sensed, a single PAC with demand V pacing  ASSESSMENT AND PLAN  Normal dual chamber pacemaker function, continue remote monitoring Q 3 months.  Patient Instructions  Remote monitoring is used to monitor your Pacemaker of ICD from home. This monitoring reduces the number of office visits required to check your device to one time per year. It allows [place] to keep an eye on the functioning of your device to ensure it is working properly. You are scheduled for a device check from home on 3 Months. You may send your transmission at any time that day. If you have a wireless device, the transmission will be sent automatically. After your physician reviews your transmission, you will receive a postcard with your next transmission date.  Your physician wants you to follow-up in: 1 Year You will receive a reminder letter in the mail two months in advance. If you don't receive a letter,  please call our office to schedule the follow-up appointment.        Orders Placed This Encounter  Procedures  . Implantable device check   Meds ordered this encounter  Medications  . alendronate (FOSAMAX) 70 MG tablet    Sig: Take 1 tablet by mouth once a week.    Refill:  1  . allopurinol (ZYLOPRIM) 100 MG tablet    Sig: Take 1 tablet by mouth daily.    Refill:  0  . simvastatin (ZOCOR) 40 MG tablet    Sig: Take 40 mg by mouth daily.    Refill:  1  . warfarin (COUMADIN) 5 MG tablet    Sig: Take 1 tablet by mouth daily. AS PER INR    Refill:  0  . alfuzosin (UROXATRAL) 10 MG 24 hr tablet    Sig: Take 10 mg by mouth every other day.    Refill:  0  . Coenzyme Q10 (CO Q-10) 100 MG CAPS    Sig: Take 1 capsule by mouth daily.  . Calcium-Vitamin D-Vitamin K (CALCIUM + D + K PO)    Sig: Take  500 mg by mouth daily.  . Cholecalciferol (VITAMIN D) 2000 UNITS tablet    Sig: Take 2,000 Units by mouth daily.  Marland Kitchen EPINEPHrine 0.3 mg/0.3 mL IJ SOAJ injection    Sig: Inject into the muscle as needed.  . hydrocodone-acetaminophen (LORCET-HD) 5-500 MG per capsule    Sig: Take 1 capsule by mouth as needed for pain.  . Multiple Vitamins-Minerals (PRESERVISION AREDS) CAPS    Sig: Take 2 capsules by mouth daily.  Marland Kitchen SALINE NASAL SPRAY NA    Sig: Place into the nose as needed.    Holli Humbles, MD, [address] [phone] office [phone] pager

## 2014-10-31 ENCOUNTER — Encounter: Payer: Self-pay | Admitting: Cardiovascular Disease

## 2014-12-24 ENCOUNTER — Other Ambulatory Visit: Payer: Self-pay | Admitting: Dermatology

## 2014-12-27 ENCOUNTER — Ambulatory Visit (INDEPENDENT_AMBULATORY_CARE_PROVIDER_SITE_OTHER): Payer: Medicare Other | Admitting: *Deleted

## 2014-12-27 DIAGNOSIS — I495 Sick sinus syndrome: Secondary | ICD-10-CM

## 2014-12-27 LAB — MDC_IDC_ENUM_SESS_TYPE_REMOTE
Battery Remaining Longevity: 23 mo
Brady Statistic AP VP Percent: 0 %
Brady Statistic AP VS Percent: 90 %
Brady Statistic AS VP Percent: 0 %
Brady Statistic AS VS Percent: 10 %
Date Time Interrogation Session: 20160303150033
Lead Channel Impedance Value: 490 Ohm
Lead Channel Pacing Threshold Amplitude: 0.5 V
Lead Channel Pacing Threshold Amplitude: 0.875 V
Lead Channel Pacing Threshold Pulse Width: 0.4 ms
Lead Channel Setting Pacing Amplitude: 1.5 V
Lead Channel Setting Pacing Amplitude: 2 V
Lead Channel Setting Pacing Pulse Width: 0.4 ms
Lead Channel Setting Sensing Sensitivity: 5.6 mV
MDC IDC MSMT BATTERY IMPEDANCE: 2336 Ohm
MDC IDC MSMT BATTERY VOLTAGE: 2.71 V
MDC IDC MSMT LEADCHNL RA IMPEDANCE VALUE: 476 Ohm
MDC IDC MSMT LEADCHNL RA PACING THRESHOLD PULSEWIDTH: 0.4 ms
MDC IDC MSMT LEADCHNL RV SENSING INTR AMPL: 16 mV

## 2014-12-27 NOTE — Progress Notes (Signed)
Remote pacemaker transmission.   

## 2015-01-02 ENCOUNTER — Telehealth: Payer: Self-pay | Admitting: *Deleted

## 2015-01-02 NOTE — Telephone Encounter (Signed)
Pt walk in, came in after appt at other local physician office, donated old transmitter.  He came in mentioning he got a letter from device clinic and was confused about indication.  From our conversation, seems he may need an cellular accessory for his home monitor.  He has a Medtr[employer] model per brochure he showed. Pointed out cellular accessory option he will need, since he will not be keeping landline service.  Pt may be reached at [phone].  I informed I would send to device pool, he was agreeable w/ this plan.

## 2015-01-03 NOTE — Telephone Encounter (Signed)
Ordered wirex for pt.

## 2015-01-10 ENCOUNTER — Encounter: Payer: Self-pay | Admitting: Cardiology

## 2015-01-23 ENCOUNTER — Encounter: Payer: Self-pay | Admitting: Cardiovascular Disease

## 2015-03-20 ENCOUNTER — Telehealth: Payer: Self-pay | Admitting: *Deleted

## 2015-03-20 NOTE — Telephone Encounter (Signed)
Pt called my direct line regarding a device related question - informed him I am unable to answer w/ confidence...will defer to device clinic to call. Please return his call, thank you.

## 2015-03-20 NOTE — Telephone Encounter (Signed)
Spoke with patient- explained how to connect wireless adapter to remote transmitter. Patient made aware of remote transmission to take place 04/01/15. Pt verbalizes understanding.

## 2015-04-01 ENCOUNTER — Ambulatory Visit (INDEPENDENT_AMBULATORY_CARE_PROVIDER_SITE_OTHER): Payer: Medicare Other | Admitting: *Deleted

## 2015-04-01 DIAGNOSIS — I495 Sick sinus syndrome: Secondary | ICD-10-CM

## 2015-04-01 NOTE — Progress Notes (Signed)
Remote pacemaker transmission.   

## 2015-04-04 ENCOUNTER — Encounter: Payer: Self-pay | Admitting: *Deleted

## 2015-04-05 LAB — CUP PACEART REMOTE DEVICE CHECK
Battery Impedance: 2789 Ohm
Battery Remaining Longevity: 19 mo
Battery Voltage: 2.71 V
Brady Statistic AP VS Percent: 89 %
Brady Statistic AS VS Percent: 10 %
Lead Channel Pacing Threshold Amplitude: 0.5 V
Lead Channel Pacing Threshold Amplitude: 0.75 V
Lead Channel Pacing Threshold Pulse Width: 0.4 ms
Lead Channel Sensing Intrinsic Amplitude: 16 mV
Lead Channel Setting Pacing Pulse Width: 0.4 ms
Lead Channel Setting Sensing Sensitivity: 5.6 mV
MDC IDC MSMT LEADCHNL RA IMPEDANCE VALUE: 459 Ohm
MDC IDC MSMT LEADCHNL RV IMPEDANCE VALUE: 484 Ohm
MDC IDC MSMT LEADCHNL RV PACING THRESHOLD PULSEWIDTH: 0.4 ms
MDC IDC SESS DTM: 20160606153412
MDC IDC SET LEADCHNL RA PACING AMPLITUDE: 1.5 V
MDC IDC SET LEADCHNL RV PACING AMPLITUDE: 2 V
MDC IDC STAT BRADY AP VP PERCENT: 1 %
MDC IDC STAT BRADY AS VP PERCENT: 0 %

## 2015-04-15 ENCOUNTER — Encounter: Payer: Self-pay | Admitting: Cardiology

## 2015-04-19 ENCOUNTER — Encounter: Payer: Self-pay | Admitting: Cardiovascular Disease

## 2015-05-08 ENCOUNTER — Encounter: Payer: Self-pay | Admitting: Cardiovascular Disease

## 2015-07-02 ENCOUNTER — Telehealth: Payer: Self-pay | Admitting: Cardiology

## 2015-07-02 ENCOUNTER — Ambulatory Visit (INDEPENDENT_AMBULATORY_CARE_PROVIDER_SITE_OTHER): Payer: Medicare Other | Admitting: *Deleted

## 2015-07-02 DIAGNOSIS — I495 Sick sinus syndrome: Secondary | ICD-10-CM

## 2015-07-02 NOTE — Telephone Encounter (Signed)
LMOVM reminding pt to send remote transmission.   

## 2015-07-03 NOTE — Progress Notes (Signed)
Remote pacemaker transmission.   

## 2015-07-12 LAB — CUP PACEART REMOTE DEVICE CHECK
Battery Impedance: 2922 Ohm
Battery Remaining Longevity: 17 mo
Battery Voltage: 2.7 V
Brady Statistic AP VS Percent: 89.7 %
Brady Statistic AS VP Percent: 0.1 % — CL
Date Time Interrogation Session: 20160916143554
Lead Channel Impedance Value: 471 Ohm
Lead Channel Pacing Threshold Amplitude: 0.5 V
Lead Channel Pacing Threshold Amplitude: 0.75 V
Lead Channel Pacing Threshold Pulse Width: 0.4 ms
Lead Channel Pacing Threshold Pulse Width: 0.4 ms
Lead Channel Setting Pacing Amplitude: 1.5 V
Lead Channel Setting Pacing Amplitude: 2 V
Lead Channel Setting Sensing Sensitivity: 5.6 mV
MDC IDC MSMT LEADCHNL RA IMPEDANCE VALUE: 454 Ohm
MDC IDC MSMT LEADCHNL RV SENSING INTR AMPL: 16 mV
MDC IDC SET LEADCHNL RV PACING PULSEWIDTH: 0.4 ms
MDC IDC STAT BRADY AP VP PERCENT: 0.8 %
MDC IDC STAT BRADY AS VS PERCENT: 9.4 %

## 2015-07-12 LAB — CUP PACEART INCLINIC DEVICE CHECK
Battery Remaining Longevity: 17 mo
Brady Statistic AP VP Percent: 0.8 %
Brady Statistic AP VS Percent: 89.7 %
Brady Statistic AS VP Percent: 0.1 % — CL
Lead Channel Impedance Value: 471 Ohm
Lead Channel Pacing Threshold Amplitude: 0.5 V
Lead Channel Pacing Threshold Amplitude: 0.75 V
Lead Channel Pacing Threshold Pulse Width: 0.4 ms
Lead Channel Sensing Intrinsic Amplitude: 16 mV
Lead Channel Setting Pacing Amplitude: 1.5 V
Lead Channel Setting Pacing Pulse Width: 0.4 ms
MDC IDC MSMT BATTERY IMPEDANCE: 2922 Ohm
MDC IDC MSMT BATTERY VOLTAGE: 2.7 V
MDC IDC MSMT LEADCHNL RA IMPEDANCE VALUE: 454 Ohm
MDC IDC MSMT LEADCHNL RV PACING THRESHOLD PULSEWIDTH: 0.4 ms
MDC IDC SESS DTM: 20160916143136
MDC IDC SET LEADCHNL RV PACING AMPLITUDE: 2 V
MDC IDC SET LEADCHNL RV SENSING SENSITIVITY: 5.6 mV
MDC IDC STAT BRADY AS VS PERCENT: 9.4 %

## 2015-07-31 ENCOUNTER — Encounter: Payer: Self-pay | Admitting: Cardiology

## 2015-08-06 ENCOUNTER — Encounter: Payer: Self-pay | Admitting: Cardiovascular Disease

## 2015-10-01 ENCOUNTER — Encounter: Payer: Self-pay | Admitting: Cardiovascular Disease

## 2015-10-01 ENCOUNTER — Ambulatory Visit (INDEPENDENT_AMBULATORY_CARE_PROVIDER_SITE_OTHER): Payer: Medicare Other | Admitting: Cardiovascular Disease

## 2015-10-01 VITALS — BP 109/64 | HR 79 | Ht 71.5 in | Wt 194.2 lb

## 2015-10-01 DIAGNOSIS — I472 Ventricular tachycardia: Secondary | ICD-10-CM | POA: Diagnosis not present

## 2015-10-01 DIAGNOSIS — Z950 Presence of cardiac pacemaker: Secondary | ICD-10-CM

## 2015-10-01 DIAGNOSIS — I495 Sick sinus syndrome: Secondary | ICD-10-CM | POA: Insufficient documentation

## 2015-10-01 DIAGNOSIS — I471 Supraventricular tachycardia: Secondary | ICD-10-CM | POA: Diagnosis not present

## 2015-10-01 DIAGNOSIS — I4729 Other ventricular tachycardia: Secondary | ICD-10-CM

## 2015-10-01 NOTE — Progress Notes (Signed)
Patient ID: David [name], male   DOB: [birth year], 81 y.o.   MRN: [id number]     Cardiology Office Note   Date:  10/01/2015   ID:  RAFER [name], DOB 10/15/1926, MRN [id number]  PCP:  Jerlyn Ly, MD  Cardiologist:   Sanda Klein, MD   Chief Complaint  Patient presents with  . Follow-up    no chest pain, no shortness of breath, no edema, occassional pain in legs, no cramping in legs, no lightheadedness or dizziness      History of Present Illness: David [name] is a 81 y.o. male who presents for  Pacemaker follow-up and paroxysmal atrial tachycardia and nonsustained ventricular tachycardia, long-term anticoagulation.  Since his last appointment he has not had any new health challenges. He denies syncope , rarely has brief palpitations, denies edema, dyspnea, angina, focal neurological deficits are claudication. He has not had any bleeding problems or falls  Normal dual chamber pacemaker function (Medtronic Adapta 2008, implanted for sinus node dysfunction) with 90% atrial pacing and 1% ventricular pacing.  Estimated 16 months of generator longevity, excellent lead parameters.  Only one episode of high ventricular rate has been recorded, lasting 15 beats in April, asymptomatic. There is no electrogram but this was most likely nonsustained ventricular tachycardia.  None since then.  He has a history of previous subdural hematoma while on warfarin therapy for post-knee replacement pulmonary embolism. Has an IVC filter and is on warfarin therapy. History of gout and hyperlipidemia without significant structural heart disease.  Labs are followed at [facility] by Dr. Joylene Draft.    Past Medical History  Diagnosis Date  . Pacemaker 2008  . Paroxysmal atrial fibrillation (HCC)   . Sinus node dysfunction (HCC)   . Venous thrombosis     following a hip fracture and surgery. 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 Dyslipidemia     . Pulmonary embolism ([place])     following a hip fracture and surgery. 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 Nonsustained ventricular tachycardia ([place])   . Osteoporosis     Severe  . Tachycardia-bradycardia syndrome ([place]) 2008  . Dyslipidemia   . Kyphosis     Past Surgical History  Procedure Laterality Date  . Pacemaker insertion      Medtronic Adapta model #ADDRO1, serial [id number]  . Total hip arthroplasty  2004     Current Outpatient Prescriptions  Medication Sig Dispense Refill  . alendronate (FOSAMAX) 70 MG tablet Take 1 tablet by mouth once a week.  1  . alfuzosin (UROXATRAL) 10 MG 24 hr tablet Take 10 mg by mouth every other day.  0  . allopurinol (ZYLOPRIM) 100 MG tablet Take 1 tablet by mouth daily.  0  . Calcium-Vitamin D-Vitamin K (CALCIUM + D + K PO) Take 500 mg by mouth daily.    . Cholecalciferol (VITAMIN D) 2000 UNITS tablet Take 2,000 Units by mouth daily.    . Coenzyme Q10 (CO Q-10) 100 MG CAPS Take 1 capsule by mouth daily.    Marland Kitchen EPINEPHrine 0.3 mg/0.3 mL IJ SOAJ injection Inject into the muscle as needed.    . hydrocodone-acetaminophen (LORCET-HD) 5-500 MG per capsule Take 1 capsule by mouth as needed for pain.    . Multiple Vitamins-Minerals (PRESERVISION AREDS) CAPS Take 2 capsules by mouth daily.    Marland Kitchen SALINE NASAL SPRAY NA Place into the nose as needed.    Marland Kitchen  simvastatin (ZOCOR) 40 MG tablet Take 40 mg by mouth daily.  1  . warfarin (COUMADIN) 5 MG tablet Take 1 tablet by mouth daily. AS PER INR  0   No current facility-administered medications for this visit.    Allergies:   Review of patient's allergies indicates no known allergies.    Social History:  The patient  reports that he quit smoking about 31 years ago. He does not have any smokeless tobacco history on file. He reports that he drinks alcohol. He reports that he does not use illicit drugs.   Family History:  The patient's family history  includes Fainting in his sister; Heart attack in his father; Stroke in his maternal grandfather, maternal grandmother, and mother.    ROS:  Please see the history of present illness.    Otherwise, review of systems positive for none.   All other systems are reviewed and negative.    PHYSICAL EXAM: VS:  BP 109/64 mmHg  Pulse 79  Ht 5' 11.5" (1.816 m)  Wt 194 lb 4 oz (88.111 kg)  BMI 26.72 kg/m2 , BMI Body mass index is 26.72 kg/(m^2).  General: Alert, oriented x3, no distress Head: no evidence of trauma, PERRL, EOMI, no exophtalmos or lid lag, no myxedema, no xanthelasma; normal ears, nose and oropharynx Neck: normal jugular venous pulsations and no hepatojugular reflux; brisk carotid pulses without delay and no carotid bruits Chest: clear to auscultation, no signs of consolidation by percussion or palpation, normal fremitus, symmetrical and full respiratory excursions Cardiovascular: normal position and quality of the apical impulse, regular rhythm, normal first and second heart sounds, no murmurs, rubs or gallops Abdomen: no tenderness or distention, no masses by palpation, no abnormal pulsatility or arterial bruits, normal bowel sounds, no hepatosplenomegaly Extremities: no clubbing, cyanosis or edema; 2+ radial, ulnar and brachial pulses bilaterally; 2+ right femoral, posterior tibial and dorsalis pedis pulses; 2+ left femoral, posterior tibial and dorsalis pedis pulses; no subclavian or femoral bruits Neurological: grossly nonfocal Psych: euthymic mood, full affect   EKG:  EKG is ordered today. 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    Lipid Panel No results found for: CHOL, TRIG, HDL, CHOLHDL, VLDL, LDLCALC, LDLDIRECT    Wt Readings from Last 3 Encounters:  10/01/15 194 lb 4 oz (88.111 kg)  09/25/14 193 lb 4.8 oz (87.68  kg)  05/26/10 196 lb (88.905 kg)        ASSESSMENT AND PLAN:  1. Sinus node dysfunction 2. Normal dual chamber permanent pacemaker function, anticipate need for generator change around the beginning of 2018 3.  Infrequent asymptomatic nonsustained ventricular tachycardia , in the absence of major structural cardiac illness. No specific therapy planned. 4.  History of venous thromboembolic disease with chronic inferior vena cava filter and warfarin anticoagulation 5.  Hyperlipidemia statin therapy   Current medicines are reviewed at length with the patient today.  The patient does not have concerns regarding medicines.  The following changes have been made:  no change  Labs/ tests ordered today include:   Orders Placed This Encounter  Procedures  . EKG 12-Lead    Patient Instructions  Remote monitoring is used to monitor your Pacemaker from home. This monitoring reduces the number of office visits required to check your device to one time per year. It allows [place] to monitor  the functioning of your device to ensure it is working properly. You are scheduled for a device check from home on January 01, 2016. You may send your transmission at any time that day. If you have a wireless device, the transmission will be sent automatically. After your physician reviews your transmission, you will receive a postcard with your next transmission date.  Dr. Sallyanne Kuster recommends that you schedule a follow-up appointment in: ONE YEAR         SignedSanda Klein, MD  10/01/2015 9:35 AM    Sanda Klein, MD, [facility] [phone] office [phone] pager

## 2015-10-01 NOTE — Patient Instructions (Signed)
Remote monitoring is used to monitor your Pacemaker from home. This monitoring reduces the number of office visits required to check your device to one time per year. It allows [place] to monitor the functioning of your device to ensure it is working properly. You are scheduled for a device check from home on January 01, 2016. You may send your transmission at any time that day. If you have a wireless device, the transmission will be sent automatically. After your physician reviews your transmission, you will receive a postcard with your next transmission date.  Dr. Sallyanne Kuster recommends that you schedule a follow-up appointment in: [place]

## 2015-10-24 LAB — CUP PACEART INCLINIC DEVICE CHECK
Battery Impedance: 3095 Ohm
Battery Voltage: 2.71 V
Brady Statistic AP VS Percent: 90 %
Brady Statistic AS VP Percent: 0 %
Brady Statistic AS VS Percent: 9 %
Date Time Interrogation Session: 20161206131138
Implantable Lead Implant Date: 20080930
Implantable Lead Location: 753859
Implantable Lead Location: 753860
Implantable Lead Model: 5076
Implantable Lead Model: 5076
Lead Channel Impedance Value: 454 Ohm
Lead Channel Impedance Value: 456 Ohm
Lead Channel Setting Pacing Amplitude: 2 V
Lead Channel Setting Pacing Pulse Width: 0.4 ms
MDC IDC LEAD IMPLANT DT: 20080930
MDC IDC MSMT BATTERY REMAINING LONGEVITY: 16 mo
MDC IDC SET LEADCHNL RA PACING AMPLITUDE: 1.5 V
MDC IDC SET LEADCHNL RV SENSING SENSITIVITY: 5.6 mV
MDC IDC STAT BRADY AP VP PERCENT: 1 %

## 2015-12-31 ENCOUNTER — Ambulatory Visit (INDEPENDENT_AMBULATORY_CARE_PROVIDER_SITE_OTHER): Payer: Medicare Other | Admitting: *Deleted

## 2015-12-31 DIAGNOSIS — I495 Sick sinus syndrome: Secondary | ICD-10-CM

## 2016-01-03 NOTE — Progress Notes (Signed)
Remote pacemaker transmission.   

## 2016-01-04 LAB — CUP PACEART REMOTE DEVICE CHECK
Battery Impedance: 3188 Ohm
Battery Remaining Longevity: 15 mo
Battery Voltage: 2.7 V
Brady Statistic AP VP Percent: 1 %
Brady Statistic AP VS Percent: 89 %
Brady Statistic AS VP Percent: 0 %
Implantable Lead Implant Date: 20080930
Implantable Lead Location: 753859
Implantable Lead Model: 5076
Lead Channel Setting Pacing Amplitude: 1.5 V
Lead Channel Setting Pacing Pulse Width: 0.4 ms
MDC IDC LEAD IMPLANT DT: 20080930
MDC IDC LEAD LOCATION: 753860
MDC IDC MSMT LEADCHNL RA IMPEDANCE VALUE: 464 Ohm
MDC IDC MSMT LEADCHNL RV IMPEDANCE VALUE: 500 Ohm
MDC IDC SESS DTM: 20170308184535
MDC IDC SET LEADCHNL RV PACING AMPLITUDE: 2 V
MDC IDC SET LEADCHNL RV SENSING SENSITIVITY: 5.6 mV
MDC IDC STAT BRADY AS VS PERCENT: 10 %

## 2016-01-04 NOTE — Progress Notes (Signed)
Normal remote reviewed. 1 NSVT episode Est longevity 15 months   Next [facility] 03/31/16

## 2016-01-08 ENCOUNTER — Encounter: Payer: Self-pay | Admitting: Cardiology

## 2016-03-18 ENCOUNTER — Encounter: Payer: Self-pay | Admitting: Internal Medicine

## 2016-03-18 ENCOUNTER — Telehealth: Payer: Self-pay | Admitting: Internal Medicine

## 2016-03-18 NOTE — Telephone Encounter (Signed)
Patient returned call re new patient hematology appointment with Dr. Julien Nordmann. Patient given appointment for 6/5 @ 2:15 pm along with address information. Patient demographic and insurance information confirmed.

## 2016-03-30 ENCOUNTER — Telehealth: Payer: Self-pay | Admitting: Internal Medicine

## 2016-03-30 ENCOUNTER — Other Ambulatory Visit: Payer: Self-pay | Admitting: Medical Oncology

## 2016-03-30 ENCOUNTER — Ambulatory Visit (HOSPITAL_BASED_OUTPATIENT_CLINIC_OR_DEPARTMENT_OTHER): Payer: Medicare Other | Admitting: Internal Medicine

## 2016-03-30 ENCOUNTER — Other Ambulatory Visit (HOSPITAL_BASED_OUTPATIENT_CLINIC_OR_DEPARTMENT_OTHER): Payer: Medicare Other

## 2016-03-30 ENCOUNTER — Other Ambulatory Visit (HOSPITAL_COMMUNITY)
Admission: RE | Admit: 2016-03-30 | Discharge: 2016-03-30 | Disposition: A | Discharge status: Home / Self Care | Payer: Medicare Other | Source: Ambulatory Visit | Attending: Internal Medicine | Admitting: Internal Medicine

## 2016-03-30 ENCOUNTER — Encounter: Payer: Self-pay | Admitting: Internal Medicine

## 2016-03-30 VITALS — BP 137/67 | HR 91 | Temp 98.0°F | Resp 18 | Ht 71.5 in | Wt 191.7 lb

## 2016-03-30 DIAGNOSIS — D72820 Lymphocytosis (symptomatic): Secondary | ICD-10-CM | POA: Diagnosis not present

## 2016-03-30 DIAGNOSIS — D696 Thrombocytopenia, unspecified: Secondary | ICD-10-CM | POA: Diagnosis not present

## 2016-03-30 DIAGNOSIS — D649 Anemia, unspecified: Secondary | ICD-10-CM

## 2016-03-30 DIAGNOSIS — D72829 Elevated white blood cell count, unspecified: Secondary | ICD-10-CM | POA: Diagnosis not present

## 2016-03-30 DIAGNOSIS — Z86718 Personal history of other venous thrombosis and embolism: Secondary | ICD-10-CM

## 2016-03-30 DIAGNOSIS — Z87891 Personal history of nicotine dependence: Secondary | ICD-10-CM

## 2016-03-30 LAB — CBC WITH DIFFERENTIAL/PLATELET
BASO%: 0.3 % (ref 0.0–2.0)
Basophils Absolute: 0 10*3/uL (ref 0.0–0.1)
EOS%: 3.6 % (ref 0.0–7.0)
Eosinophils Absolute: 0.4 10*3/uL (ref 0.0–0.5)
HEMATOCRIT: 35 % — AB (ref 38.4–49.9)
HEMOGLOBIN: 11.3 g/dL — AB (ref 13.0–17.1)
LYMPH#: 7.7 10*3/uL — AB (ref 0.9–3.3)
LYMPH%: 63.9 % — ABNORMAL HIGH (ref 14.0–49.0)
MCH: 31.7 pg (ref 27.2–33.4)
MCHC: 32.1 g/dL (ref 32.0–36.0)
MCV: 98.7 fL — ABNORMAL HIGH (ref 79.3–98.0)
MONO#: 0.7 10*3/uL (ref 0.1–0.9)
MONO%: 6.2 % (ref 0.0–14.0)
NEUT%: 26 % — AB (ref 39.0–75.0)
NEUTROS ABS: 3.1 10*3/uL (ref 1.5–6.5)
PLATELETS: 114 10*3/uL — AB (ref 140–400)
RBC: 3.55 10*6/uL — ABNORMAL LOW (ref 4.20–5.82)
RDW: 14.2 % (ref 11.0–14.6)
WBC: 12 10*3/uL — AB (ref 4.0–10.3)

## 2016-03-30 LAB — COMPREHENSIVE METABOLIC PANEL
ALT: 11 U/L (ref 0–55)
AST: 22 U/L (ref 5–34)
Albumin: 3.7 g/dL (ref 3.5–5.0)
Alkaline Phosphatase: 59 U/L (ref 40–150)
Anion Gap: 6 mEq/L (ref 3–11)
BILIRUBIN TOTAL: 0.38 mg/dL (ref 0.20–1.20)
BUN: 23.8 mg/dL (ref 7.0–26.0)
CALCIUM: 8.9 mg/dL (ref 8.4–10.4)
CHLORIDE: 110 meq/L — AB (ref 98–109)
CO2: 26 mEq/L (ref 22–29)
CREATININE: 1.4 mg/dL — AB (ref 0.7–1.3)
EGFR: 45 mL/min/{1.73_m2} — ABNORMAL LOW (ref >90)
Glucose: 83 mg/dl (ref 70–140)
Potassium: 4.5 mEq/L (ref 3.5–5.1)
Sodium: 142 mEq/L (ref 136–145)
TOTAL PROTEIN: 6.4 g/dL (ref 6.4–8.3)

## 2016-03-30 LAB — LACTATE DEHYDROGENASE: LDH: 246 U/L — AB (ref 125–245)

## 2016-03-30 LAB — TECHNOLOGIST REVIEW

## 2016-03-30 NOTE — Telephone Encounter (Signed)
Gave pt apt & avs °

## 2016-03-30 NOTE — Progress Notes (Signed)
[place] [phone]   [phone]  CONSULT NOTE  REFERRING PHYSICIAN: Dr. [name]  REASON FOR CONSULTATION:  82 years old white male with persistent leukocytosis.  HPI David [name] is a 82 y.o. male with past medical history significant for history of deep venous thrombosis currently on Coumadin and status post IVC filter placement. The patient also has a history of paroxysmal atrial tachycardia status post pacemaker placement. He also has a history of colon polyps as well as stroke. He was seen recently by his primary care physician Dr. Joylene Draft and was noted on blood work to have elevated white blood count of 14,000 with an elevated absolute lymphocyte count as well as low platelets of 133,000. He was referred to me today for evaluation and recommendation regarding his condition. I have seen the patient in the past in 2007 for evaluation of similar condition. Bone marrow biopsy and aspirate at that time was suspicious but not diagnostic for a lymphoproliferative disorder. The patient was followed by observation since that time and he did not have any significant symptoms.  When seen today he denied having any specific complaints except for chronic low back pain secondary to degenerative disc disease. He has no significant chest pain, shortness of breath, cough or hemoptysis. He denied having any significant weight loss but noticed some night sweats over the last 2 weeks. He denied having any nausea, vomiting, diarrhea or constipation. He has no fever or chills. Family history significant for a mother with a stroke and father died from heart attack. The patient is married for 63 years and has 2 children. 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 He has remote history of smoking for around 8 years. He drinks alcohol occasionally and no history of drug abuse. HPI  Past Medical History  Diagnosis Date    . Pacemaker 2008  . Paroxysmal atrial fibrillation (HCC)   . Sinus node dysfunction (HCC)   . Venous thrombosis     following a hip fracture and surgery. 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 Dyslipidemia   . Pulmonary embolism ([place])     following a hip fracture and surgery. 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 Nonsustained ventricular tachycardia ([place])   . Osteoporosis     Severe  . Tachycardia-bradycardia syndrome ([place]) 2008  . Dyslipidemia   . Kyphosis   . Leukocytosis 03/30/2016  . Lymphocytosis 03/30/2016    Past Surgical History  Procedure Laterality Date  . Pacemaker insertion      Medtronic Adapta model #ADDRO1, serial [id number]  . Total hip arthroplasty  2004    Family History  Problem Relation Age of Onset  . Stroke Mother   . Heart attack Father   . Stroke Maternal Grandmother   . Stroke Maternal Grandfather   . Fainting Sister     Social History Social History  Substance Use Topics  . Smoking status: Former Smoker    Quit date: 09/25/1984  . Smokeless tobacco: None  . Alcohol Use: Yes    No Known Allergies  Current Outpatient Prescriptions  Medication Sig Dispense Refill  . alendronate (FOSAMAX) 70 MG tablet Take 1 tablet by mouth once a week.  1  . alfuzosin (UROXATRAL) 10 MG 24 hr tablet Take 10 mg by mouth every other day.  0  .  allopurinol (ZYLOPRIM) 100 MG tablet Take 1 tablet by mouth daily.  0  . Calcium-Vitamin D-Vitamin K (CALCIUM + D + K PO) Take 500 mg by mouth daily.    . Cholecalciferol (VITAMIN D) 2000 UNITS tablet Take 2,000 Units by mouth daily.    . Coenzyme Q10 (CO Q-10) 100 MG CAPS Take 1 capsule by mouth daily.    Marland Kitchen EPINEPHrine 0.3 mg/0.3 mL IJ SOAJ injection Inject into the muscle as needed.    . fexofenadine (ALLEGRA) 180 MG tablet Take 180 mg by mouth daily.    . hydrocodone-acetaminophen (LORCET-HD) 5-500 MG per capsule Take 1  capsule by mouth as needed for pain.    Marland Kitchen levothyroxine (SYNTHROID, LEVOTHROID) 25 MCG tablet Take 25 mcg by mouth daily.  1  . Multiple Vitamins-Minerals (PRESERVISION AREDS) CAPS Take 2 capsules by mouth daily.    Marland Kitchen SALINE NASAL SPRAY NA Place into the nose as needed.    . simvastatin (ZOCOR) 40 MG tablet Take 40 mg by mouth daily.  1  . vitamin B-12 (CYANOCOBALAMIN) 1000 MCG tablet Take 1,000 mcg by mouth daily.    Marland Kitchen warfarin (COUMADIN) 5 MG tablet Take 1 tablet by mouth daily. AS PER INR  0   No current facility-administered medications for this visit.    Review of Systems  Constitutional: negative Eyes: negative Ears, nose, mouth, throat, and face: negative Respiratory: negative Cardiovascular: negative Gastrointestinal: negative Genitourinary:negative Integument/breast: negative Hematologic/lymphatic: negative Musculoskeletal:positive for back pain Neurological: negative Behavioral/Psych: negative Endocrine: negative Allergic/Immunologic: negative  Physical Exam  HAL:PFXTK, healthy, no distress, well nourished and well developed SKIN: skin color, texture, turgor are normal, no rashes or significant lesions HEAD: Normocephalic, No masses, lesions, tenderness or abnormalities EYES: normal, PERRLA, Conjunctiva are pink and non-injected EARS: External ears normal, Canals clear OROPHARYNX:no exudate and no erythema  NECK: supple, no adenopathy, no JVD LYMPH:  no palpable lymphadenopathy, no hepatosplenomegaly LUNGS: clear to auscultation , and palpation HEART: regular rate & rhythm, no murmurs and no gallops ABDOMEN:abdomen soft, non-tender, normal bowel sounds and no masses or organomegaly BACK: Back symmetric, no curvature., No CVA tenderness EXTREMITIES:no joint deformities, effusion, or inflammation, no edema, no skin discoloration  NEURO: alert & oriented x 3 with fluent speech, no focal motor/sensory deficits  PERFORMANCE STATUS: ECOG 1  LABORATORY DATA: Lab  Results  Component Value Date   WBC 8.8 03/10/2006   HGB 11.3* 03/10/2006   HCT 32.9* 03/10/2006   MCV 96.0 03/10/2006   PLT 115* 07/26/2007      Chemistry   No results found for: NA, K, CL, CO2, BUN, CREATININE, GLU No results found for: CALCIUM, ALKPHOS, AST, ALT, BILITOT     RADIOGRAPHIC STUDIES: No results found.  ASSESSMENT: This is a very pleasant 82 years old white male with questionable lymphoproliferative disorder highly suspicious for chronic lymphocytic leukemia presented with leukocytosis, lymphocytosis as well as anemia and thrombocytopenia. The patient is currently asymptomatic.   PLAN: I have a lengthy discussion with the patient and his wife today about his current condition and further investigation to confirm diagnosis. I ordered several studies today for evaluation of his condition including repeat CBC, comprehensive metabolic panel, LDH as well as flow cytometry of the peripheral blood to rule out chronic lymphocytic leukemia. I recommended for the patient to continue on observation for now since he is currently asymptomatic. I would see him back for follow-up visit in 6 months with repeat blood work. I'll be happy to see the patient sooner if you  do follow any concerning symptoms. The patient and his wife agreed to the current plan. He was advised to call immediately if he has any concerning symptoms in the interval.  The patient voices understanding of current disease status and treatment options and is in agreement with the current care plan.  All questions were answered. The patient knows to call the clinic with any problems, questions or concerns. We can certainly see the patient much sooner if necessary.  Thank you so much for allowing me to participate in the care of [employer]. I will continue to follow up the patient with you and assist in his care.  I spent 40 minutes counseling the patient face to face. The total time spent in the appointment  was 60 minutes.  Disclaimer: This note was dictated with voice recognition software. Similar sounding words can inadvertently be transcribed and may not be corrected upon review.   Fiorella Hanahan K. March 30, 2016, 2:58 PM

## 2016-03-31 ENCOUNTER — Telehealth: Payer: Self-pay | Admitting: Cardiology

## 2016-03-31 ENCOUNTER — Ambulatory Visit (INDEPENDENT_AMBULATORY_CARE_PROVIDER_SITE_OTHER): Payer: Medicare Other | Admitting: *Deleted

## 2016-03-31 DIAGNOSIS — I495 Sick sinus syndrome: Secondary | ICD-10-CM

## 2016-03-31 LAB — CUP PACEART REMOTE DEVICE CHECK
Battery Impedance: 3476 Ohm
Battery Voltage: 2.69 V
Brady Statistic AP VS Percent: 89 %
Brady Statistic AS VP Percent: 0 %
Date Time Interrogation Session: 20170606154957
Implantable Lead Implant Date: 20080930
Implantable Lead Location: 753859
Implantable Lead Model: 5076
Lead Channel Impedance Value: 465 Ohm
Lead Channel Impedance Value: 482 Ohm
Lead Channel Setting Pacing Amplitude: 1.5 V
Lead Channel Setting Pacing Pulse Width: 0.4 ms
MDC IDC LEAD IMPLANT DT: 20080930
MDC IDC LEAD LOCATION: 753860
MDC IDC MSMT BATTERY REMAINING LONGEVITY: 13 mo
MDC IDC SET LEADCHNL RV PACING AMPLITUDE: 2 V
MDC IDC SET LEADCHNL RV SENSING SENSITIVITY: 5.6 mV
MDC IDC STAT BRADY AP VP PERCENT: 1 %
MDC IDC STAT BRADY AS VS PERCENT: 10 %

## 2016-03-31 NOTE — Telephone Encounter (Signed)
LMOVM reminding pt to send remote transmission.   

## 2016-03-31 NOTE — Progress Notes (Signed)
Remote pacemaker transmission.   

## 2016-04-01 LAB — FLOW CYTOMETRY

## 2016-04-15 ENCOUNTER — Encounter: Payer: Self-pay | Admitting: Cardiology

## 2016-06-30 ENCOUNTER — Ambulatory Visit (INDEPENDENT_AMBULATORY_CARE_PROVIDER_SITE_OTHER): Payer: Medicare Other | Admitting: *Deleted

## 2016-06-30 DIAGNOSIS — I495 Sick sinus syndrome: Secondary | ICD-10-CM | POA: Diagnosis not present

## 2016-07-01 NOTE — Progress Notes (Signed)
Remote pacemaker transmission.   

## 2016-07-03 ENCOUNTER — Encounter: Payer: Self-pay | Admitting: Cardiology

## 2016-07-07 LAB — CUP PACEART REMOTE DEVICE CHECK
Battery Impedance: 3802 Ohm
Battery Remaining Longevity: 11 mo
Battery Voltage: 2.68 V
Brady Statistic AP VP Percent: 1 %
Brady Statistic AP VS Percent: 89 %
Brady Statistic AS VS Percent: 10 %
Implantable Lead Implant Date: 20080930
Implantable Lead Location: 753859
Implantable Lead Model: 5076
Lead Channel Impedance Value: 469 Ohm
Lead Channel Impedance Value: 476 Ohm
Lead Channel Pacing Threshold Amplitude: 0.625 V
Lead Channel Sensing Intrinsic Amplitude: 16 mV
MDC IDC LEAD IMPLANT DT: 20080930
MDC IDC LEAD LOCATION: 753860
MDC IDC MSMT LEADCHNL RA PACING THRESHOLD PULSEWIDTH: 0.4 ms
MDC IDC MSMT LEADCHNL RV PACING THRESHOLD AMPLITUDE: 0.875 V
MDC IDC MSMT LEADCHNL RV PACING THRESHOLD PULSEWIDTH: 0.4 ms
MDC IDC SESS DTM: 20170905131530
MDC IDC SET LEADCHNL RA PACING AMPLITUDE: 1.5 V
MDC IDC SET LEADCHNL RV PACING AMPLITUDE: 2 V
MDC IDC SET LEADCHNL RV PACING PULSEWIDTH: 0.4 ms
MDC IDC SET LEADCHNL RV SENSING SENSITIVITY: 5.6 mV
MDC IDC STAT BRADY AS VP PERCENT: 0 %

## 2016-09-29 ENCOUNTER — Telehealth: Payer: Self-pay | Admitting: Internal Medicine

## 2016-09-29 ENCOUNTER — Ambulatory Visit (HOSPITAL_BASED_OUTPATIENT_CLINIC_OR_DEPARTMENT_OTHER): Payer: Medicare Other | Admitting: Internal Medicine

## 2016-09-29 ENCOUNTER — Encounter: Payer: Self-pay | Admitting: Internal Medicine

## 2016-09-29 ENCOUNTER — Other Ambulatory Visit (HOSPITAL_BASED_OUTPATIENT_CLINIC_OR_DEPARTMENT_OTHER): Payer: Medicare Other

## 2016-09-29 ENCOUNTER — Other Ambulatory Visit: Payer: Medicare Other

## 2016-09-29 DIAGNOSIS — D72829 Elevated white blood cell count, unspecified: Secondary | ICD-10-CM | POA: Diagnosis not present

## 2016-09-29 DIAGNOSIS — C8307 Small cell B-cell lymphoma, spleen: Secondary | ICD-10-CM

## 2016-09-29 DIAGNOSIS — D471 Chronic myeloproliferative disease: Secondary | ICD-10-CM | POA: Diagnosis not present

## 2016-09-29 DIAGNOSIS — D72820 Lymphocytosis (symptomatic): Secondary | ICD-10-CM

## 2016-09-29 HISTORY — DX: Small cell b-cell lymphoma, spleen: C83.07

## 2016-09-29 LAB — CBC WITH DIFFERENTIAL/PLATELET
BASO%: 0.2 % (ref 0.0–2.0)
Basophils Absolute: 0 10*3/uL (ref 0.0–0.1)
EOS%: 2.2 % (ref 0.0–7.0)
Eosinophils Absolute: 0.3 10*3/uL (ref 0.0–0.5)
HEMATOCRIT: 33.6 % — AB (ref 38.4–49.9)
HEMOGLOBIN: 11.1 g/dL — AB (ref 13.0–17.1)
LYMPH#: 10.3 10*3/uL — AB (ref 0.9–3.3)
LYMPH%: 68.2 % — AB (ref 14.0–49.0)
MCH: 32.6 pg (ref 27.2–33.4)
MCHC: 33 g/dL (ref 32.0–36.0)
MCV: 98.5 fL — ABNORMAL HIGH (ref 79.3–98.0)
MONO#: 0.7 10*3/uL (ref 0.1–0.9)
MONO%: 4.5 % (ref 0.0–14.0)
NEUT%: 24.9 % — ABNORMAL LOW (ref 39.0–75.0)
NEUTROS ABS: 3.8 10*3/uL (ref 1.5–6.5)
Platelets: 124 10*3/uL — ABNORMAL LOW (ref 140–400)
RBC: 3.41 10*6/uL — ABNORMAL LOW (ref 4.20–5.82)
RDW: 14.3 % (ref 11.0–14.6)
WBC: 15 10*3/uL — AB (ref 4.0–10.3)
nRBC: 0 % (ref 0–0)

## 2016-09-29 LAB — COMPREHENSIVE METABOLIC PANEL
ALT: 12 U/L (ref 0–55)
AST: 23 U/L (ref 5–34)
Albumin: 3.4 g/dL — ABNORMAL LOW (ref 3.5–5.0)
Alkaline Phosphatase: 70 U/L (ref 40–150)
Anion Gap: 8 mEq/L (ref 3–11)
BUN: 30.5 mg/dL — AB (ref 7.0–26.0)
CALCIUM: 9 mg/dL (ref 8.4–10.4)
CHLORIDE: 111 meq/L — AB (ref 98–109)
CO2: 24 mEq/L (ref 22–29)
CREATININE: 1.3 mg/dL (ref 0.7–1.3)
EGFR: 48 mL/min/{1.73_m2} — ABNORMAL LOW (ref >90)
GLUCOSE: 62 mg/dL — AB (ref 70–140)
Potassium: 4.2 mEq/L (ref 3.5–5.1)
SODIUM: 142 meq/L (ref 136–145)
Total Bilirubin: 0.42 mg/dL (ref 0.20–1.20)
Total Protein: 6.4 g/dL (ref 6.4–8.3)

## 2016-09-29 LAB — LACTATE DEHYDROGENASE: LDH: 227 U/L (ref 125–245)

## 2016-09-29 LAB — TECHNOLOGIST REVIEW

## 2016-09-29 NOTE — Telephone Encounter (Signed)
Appointments scheduled per 1/25 LOS. Patient given AVS report and calendars with future scheduled appointments. °

## 2016-09-29 NOTE — Progress Notes (Signed)
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  PRIOR THERAPY: None  CURRENT THERAPY: Observation.  INTERVAL HISTORY: David [name] 82 y.o. male returns to the clinic today for follow-up visit accompanied by his wife. The patient is feeling fine today except for generalized fatigue and weakness in his lower extremities. He denied having any recent weight loss or night sweats. He has no nausea or vomiting. He has no fever or chills. He denied having any significant chest pain but has shortness breath with exertion was no cough or hemoptysis. He denied having any bleeding, bruises or ecchymosis. She had flow cytometry of the peripheral blood to rule out chronic lymphocytic leukemia and the final report was suspicious for splenic marginal zone lymphoma. He is here today for evaluation and discussion of his lab and treatment options.  MEDICAL HISTORY: Past Medical History:  Diagnosis Date  . Dyslipidemia   . Dyslipidemia   . Kyphosis   . Leukocytosis 03/30/2016  . Lymphocytosis 03/30/2016  . Nonsustained ventricular tachycardia ([place])   . Osteoporosis    Severe  . Pacemaker 2008  . Paroxysmal atrial fibrillation (HCC)   . Pulmonary embolism ([place])    following a hip fracture and surgery. 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 Sinus node dysfunction (HCC)   . Tachycardia-bradycardia syndrome ([place]) 2008  . Venous thrombosis    following a hip fracture and surgery. He has an inferior vena cava filter in place and on chronic warfarin anticoagulation    ALLERGIES:  has No Known Allergies.  MEDICATIONS:  Current Outpatient Prescriptions  Medication Sig Dispense Refill  . alendronate (FOSAMAX) 70 MG tablet Take 1 tablet by mouth  once a week.  1  . alfuzosin (UROXATRAL) 10 MG 24 hr tablet Take 10 mg by mouth every other day.  0  . allopurinol (ZYLOPRIM) 100 MG tablet Take 1 tablet by mouth daily.  0  . Calcium-Vitamin D-Vitamin K (CALCIUM + D + K PO) Take 500 mg by mouth daily.    . Cholecalciferol (VITAMIN D) 2000 UNITS tablet Take 2,000 Units by mouth daily.    . Coenzyme Q10 (CO Q-10) 100 MG CAPS Take 1 capsule by mouth daily.    Marland Kitchen EPINEPHrine 0.3 mg/0.3 mL IJ SOAJ injection Inject into the muscle as needed.    . fexofenadine (ALLEGRA) 180 MG tablet Take 180 mg by mouth daily.    . hydrocodone-acetaminophen (LORCET-HD) 5-500 MG per capsule Take 1 capsule by mouth as needed for pain.    Marland Kitchen levothyroxine (SYNTHROID, LEVOTHROID) 25 MCG tablet Take 25 mcg by mouth daily.  1  . Multiple Vitamins-Minerals (PRESERVISION AREDS) CAPS Take 2 capsules by mouth daily.    Marland Kitchen SALINE NASAL SPRAY NA Place into the nose as needed.    . simvastatin (ZOCOR) 40 MG tablet Take 40 mg by mouth daily.  1  . vitamin B-12 (CYANOCOBALAMIN) 1000 MCG tablet Take 1,000 mcg by mouth daily.    Marland Kitchen warfarin (COUMADIN) 5 MG tablet Take 1 tablet by mouth daily. AS PER INR  0   No current facility-administered medications for this visit.     SURGICAL HISTORY:  Past Surgical History:  Procedure Laterality Date  . PACEMAKER INSERTION     Medtronic Adapta model #ADDRO1, serial [id number]  .  TOTAL HIP ARTHROPLASTY  2004    REVIEW OF SYSTEMS:  A comprehensive review of systems was negative except for: Constitutional: positive for fatigue Musculoskeletal: positive for muscle weakness   PHYSICAL EXAMINATION: General appearance: alert, cooperative, fatigued and no distress Head: Normocephalic, without obvious abnormality, atraumatic Neck: no adenopathy, no JVD, supple, symmetrical, trachea midline and thyroid not enlarged, symmetric, no tenderness/mass/nodules Lymph nodes: Cervical, supraclavicular, and axillary nodes normal. Resp: clear to  auscultation bilaterally Back: symmetric, no curvature. ROM normal. No CVA tenderness. Cardio: regular rate and rhythm, S1, S2 normal, no murmur, click, rub or gallop GI: soft, non-tender; bowel sounds normal; no masses,  no organomegaly Extremities: extremities normal, atraumatic, no cyanosis or edema  ECOG PERFORMANCE STATUS: 2 - Symptomatic, <50% confined to bed  Blood pressure 140/75, pulse 84, temperature 97.8 F (36.6 C), temperature source Oral, resp. rate 18, height 5' 11.5" (1.816 m), weight 196 lb 8 oz (89.1 kg), SpO2 99 %.  LABORATORY DATA: Lab Results  Component Value Date   WBC 15.0 (H) 09/29/2016   HGB 11.1 (L) 09/29/2016   HCT 33.6 (L) 09/29/2016   MCV 98.5 (H) 09/29/2016   PLT 124 (L) 09/29/2016      Chemistry      Component Value Date/Time   NA 142 09/29/2016 1035   K 4.2 09/29/2016 1035   CO2 24 09/29/2016 1035   BUN 30.5 (H) 09/29/2016 1035   CREATININE 1.3 09/29/2016 1035      Component Value Date/Time   CALCIUM 9.0 09/29/2016 1035   ALKPHOS 70 09/29/2016 1035   AST 23 09/29/2016 1035   ALT 12 09/29/2016 1035   BILITOT 0.42 09/29/2016 1035       RADIOGRAPHIC STUDIES: No results found.  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 I discussed the final pathology report and lab result with the patient and his wife today. I recommended for him to have ultrasound of the abdomen to evaluate the size of the liver and spleen. I also recommended for the patient to continue on observation for now with repeat CBC, comprehensive metabolic panel and LDH in 3 months. He was advised to call immediately if he has any concerning symptoms in the interval. The patient voices understanding of current disease status and treatment options and is in agreement with the current care plan.  All questions were answered. The patient knows to  call the clinic with any problems, questions or concerns. We can certainly see the patient much sooner if necessary.  I spent 10 minutes counseling the patient face to face. The total time spent in the appointment was 15 minutes.  Disclaimer: This note was dictated with voice recognition software. Similar sounding words can inadvertently be transcribed and may not be corrected upon review.

## 2016-10-06 ENCOUNTER — Encounter: Payer: Self-pay | Admitting: Cardiovascular Disease

## 2016-10-06 ENCOUNTER — Ambulatory Visit (INDEPENDENT_AMBULATORY_CARE_PROVIDER_SITE_OTHER): Payer: Medicare Other | Admitting: Cardiovascular Disease

## 2016-10-06 VITALS — BP 118/70 | HR 84 | Ht 71.5 in | Wt 198.8 lb

## 2016-10-06 DIAGNOSIS — Z950 Presence of cardiac pacemaker: Secondary | ICD-10-CM

## 2016-10-06 DIAGNOSIS — C8307 Small cell B-cell lymphoma, spleen: Secondary | ICD-10-CM

## 2016-10-06 DIAGNOSIS — I472 Ventricular tachycardia: Secondary | ICD-10-CM

## 2016-10-06 DIAGNOSIS — Z9889 Other specified postprocedural states: Secondary | ICD-10-CM

## 2016-10-06 DIAGNOSIS — E7800 Pure hypercholesterolemia, unspecified: Secondary | ICD-10-CM

## 2016-10-06 DIAGNOSIS — I495 Sick sinus syndrome: Secondary | ICD-10-CM | POA: Diagnosis not present

## 2016-10-06 DIAGNOSIS — I4729 Other ventricular tachycardia: Secondary | ICD-10-CM

## 2016-10-06 DIAGNOSIS — Z95828 Presence of other vascular implants and grafts: Secondary | ICD-10-CM

## 2016-10-06 NOTE — Patient Instructions (Addendum)
Dr Sallyanne Kuster recommends that you continue on your current medications as directed. Please refer to the Current Medication list given to you today.  Remote monitoring is used to monitor your Pacemaker of ICD from home. This monitoring reduces the number of office visits required to check your device to one time per year. It allows [place] to keep an eye on the functioning of your device to ensure it is working properly. You are scheduled for a device check from home on Tuesday, March 13th, 2018. You may send your transmission at any time that day. If you have a wireless device, the transmission will be sent automatically. After your physician reviews your transmission, you will receive a postcard with your next transmission date.  Dr Sallyanne Kuster recommends that you schedule a follow-up appointment in 12 months with a pacemaker check. You will receive a reminder letter in the mail two months in advance. If you don't receive a letter, please call our office to schedule the follow-up appointment.  If you need a refill on your cardiac medications before your next appointment, please call your pharmacy.

## 2016-10-06 NOTE — Progress Notes (Signed)
Cardiology Office Note    Date:  10/06/2016   ID:  David [name], DOB 04/02/1926, MRN [id number]  PCP:  Jerlyn Ly, MD  Cardiologist:   Sanda Klein, MD   Chief Complaint  Patient presents with  . Pacemaker Check    Paroxysmal atrial tachycardia     History of Present Illness:  David [name] is a 82 y.o. male with sinus node dysfunction, paroxysmal atrial tachycardia and nonsustained ventricular tachycardia returning for follow-up. He has a dual-chamber permanent pacemaker (Medtronic Adapta implanted 2008) and this is checked today.  He denies any cardiovascular complaints. He is unaware of palpitations in the recent past. He denies syncope, edema, dyspnea, angina focal neurological deficits or intermittent claudication. He has not had any recent bleeding problems or falls. In the remote past she did have a subdural hematoma while on warfarin therapy for pulmonary embolism after knee replacement surgery. He still has an inferior vena cava filter in place and is on maintenance warfarin therapy. Followed by Dr. 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 He is due to have a splenic ultrasound in anticipation of possible need for treatment.  Pacemaker interrogation shows anticipated device longevity of another 9 months. He is not pacemaker dependent. Does have close to 90% atrial pacing. He has not had atrial fibrillation. A few episodes of paroxysmal atrial tachycardia have been seen, consistently very brief. He virtually never requires ventricular pacing. Lead parameters remain excellent.  Previous cardiac workup: Echo 2011, myocardial perfusion study 2007 have not shown evidence of structural cardiac illness.  Past Medical History:  Diagnosis Date  . Dyslipidemia   . Dyslipidemia   . Kyphosis   . Leukocytosis 03/30/2016  . Lymphocytosis 03/30/2016  . Nonsustained ventricular tachycardia ([place])   . Osteoporosis    Severe  . Pacemaker 2008  . Paroxysmal  atrial fibrillation (HCC)   . Pulmonary embolism ([place])    following a hip fracture and surgery. 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 Sinus node dysfunction (HCC)   . 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 Tachycardia-bradycardia syndrome ([place]) 2008  . Venous thrombosis    following a hip fracture and surgery. He has an inferior vena cava filter in place and on chronic warfarin anticoagulation    Past Surgical History:  Procedure Laterality Date  . PACEMAKER INSERTION     Medtronic Adapta model #ADDRO1, serial [id number]  . TOTAL HIP ARTHROPLASTY  2004    Current Medications: Outpatient Medications Prior to Visit  Medication Sig Dispense Refill  . alendronate (FOSAMAX) 70 MG tablet Take 1 tablet by mouth once a week.  1  . alfuzosin (UROXATRAL) 10 MG 24 hr tablet Take 10 mg by mouth every other day.  0  . allopurinol (ZYLOPRIM) 100 MG tablet Take 1 tablet by mouth daily.  0  . Calcium-Vitamin D-Vitamin K (CALCIUM + D + K PO) Take 500 mg by mouth daily.    . Cholecalciferol (VITAMIN D) 2000 UNITS tablet Take 2,000 Units by mouth daily.    . Coenzyme Q10 (CO Q-10) 100 MG CAPS Take 1 capsule by mouth daily.    Marland Kitchen EPINEPHrine 0.3 mg/0.3 mL IJ SOAJ injection Inject into the muscle as needed.    . fexofenadine (ALLEGRA) 180 MG tablet Take 180 mg by mouth daily.    . hydrocodone-acetaminophen (LORCET-HD) 5-500 MG per capsule Take 1 capsule by mouth as needed for pain.    Marland Kitchen  levothyroxine (SYNTHROID, LEVOTHROID) 25 MCG tablet Take 25 mcg by mouth daily.  1  . Multiple Vitamins-Minerals (PRESERVISION AREDS) CAPS Take 2 capsules by mouth daily.    Marland Kitchen SALINE NASAL SPRAY NA Place into the nose as needed.    . simvastatin (ZOCOR) 40 MG tablet Take 40 mg by mouth daily.  1  . vitamin B-12 (CYANOCOBALAMIN) 1000 MCG tablet Take 1,000 mcg by mouth daily.    Marland Kitchen warfarin (COUMADIN) 5 MG tablet Take 1 tablet by mouth daily. AS PER INR  0     No facility-administered medications prior to visit.      Allergies:   Patient has no known allergies.   Social History   Social History  . Marital status: Married    Spouse name: N/A  . Number of children: N/A  . Years of education: N/A   Social History Main Topics  . Smoking status: Former Smoker    Quit date: 09/25/1984  . Smokeless tobacco: Never Used  . Alcohol use Yes  . Drug use: No  . Sexual activity: Not Asked   Other Topics Concern  . None   Social History Narrative  . None     Family History:  The patient's family history includes Fainting in his sister; Heart attack in his father; Stroke in his maternal grandfather, maternal grandmother, and mother.   ROS:   Please see the history of present illness.    ROS All other systems reviewed and are negative.   PHYSICAL EXAM:   VS:  BP 118/70   Pulse 84   Ht 5' 11.5" (1.816 m)   Wt 198 lb 12.8 oz (90.2 kg)   BMI 27.34 kg/m    GEN: Well nourished, well developed, in no acute distress  HEENT: normal  Neck: no JVD, carotid bruits, or masses Cardiac: RRR; no murmurs, rubs, or gallops,no edema, left subclavian pacemaker site looks healthy Respiratory:  clear to auscultation bilaterally, normal work of breathing GI: soft, nontender, nondistended, + BS MS: no deformity or atrophy  Skin: warm and dry, no rash Neuro:  Alert and Oriented x 3, Strength and sensation are intact Psych: euthymic mood, full affect  Wt Readings from Last 3 Encounters:  10/06/16 198 lb 12.8 oz (90.2 kg)  09/29/16 196 lb 8 oz (89.1 kg)  03/30/16 191 lb 11.2 oz (87 kg)      Studies/Labs Reviewed:   EKG:  EKG is ordered today.  The ekg ordered today demonstrates A paced, ventricular sensed rhythm with long AV delay  Recent Labs: 09/29/2016: ALT 12; BUN 30.5; Creatinine 1.3; HGB 11.1; Platelets 124; Potassium 4.2; Sodium 142   Additional studies/ records that were reviewed today include:  Notes from oncology clinic  ASSESSMENT:     1. Tachycardia-bradycardia syndrome ([place])   2. Pacemaker   3. Nonsustained ventricular tachycardia ([place])   4. History of inferior vena caval filter placement   5. Pure hypercholesterolemia   6. Splenic marginal zone b-cell lymphoma (HCC)      PLAN:  In order of problems listed above:  1. SSS: Satisfactory clinical symptom relief and good heart rate histogram distribution with current sensor settings. He is not pacemaker dependent. 2. PM: devce likely to reach elective replacement indicator before the end of the year. Will start checking the battery on a monthly basis during the last anticipated 3 months of generator function. Described the symptoms of asynchronous ventricular pacing that should prompt him to call us if noted. Reviewed the technique and possible  complications of generator change out in detail. Anticipate will be performing this without interruption of warfarin anticoagulation. Discussed the small but real risk of device pocket infection at the time of generator change out. 3. NSVT and PAT: Very infrequent and consistently asymptomatic. No specific therapy needed 4. History of pulmonary embolism and chronic inferior vena cava filter: Continue long-term warfarin anticoagulation. 5. HLP: Try to retrieve his most recent lipid profile from primary care provider. On statin. 6. Questionable lymphoproliferative disorder suspicious for splenic marginal zone lymphoma based on flow cytometry. At this point the plan is for observation, no active therapy    Medication Adjustments/Labs and Tests Ordered: Current medicines are reviewed at length with the patient today.  Concerns regarding medicines are outlined above.  Medication changes, Labs and Tests ordered today are listed in the Patient Instructions below. Patient Instructions  Dr Sallyanne Kuster recommends that you continue on your current medications as directed. Please refer to the Current Medication list given to you today.  Remote  monitoring is used to monitor your Pacemaker of ICD from home. This monitoring reduces the number of office visits required to check your device to one time per year. It allows [place] to keep an eye on the functioning of your device to ensure it is working properly. You are scheduled for a device check from home on Tuesday, March 13th, 2018. You may send your transmission at any time that day. If you have a wireless device, the transmission will be sent automatically. After your physician reviews your transmission, you will receive a postcard with your next transmission date.  Dr Sallyanne Kuster recommends that you schedule a follow-up appointment in 12 months with a pacemaker check. You will receive a reminder letter in the mail two months in advance. If you don't receive a letter, please call our office to schedule the follow-up appointment.  If you need a refill on your cardiac medications before your next appointment, please call your pharmacy.    Signed, Sanda Klein, MD  10/06/2016 6:01 PM    [facility] [place], [place], [place]  [address] Phone: [phone]; Fax: [phone]

## 2016-10-07 ENCOUNTER — Ambulatory Visit (HOSPITAL_COMMUNITY)
Admission: RE | Admit: 2016-10-07 | Discharge: 2016-10-07 | Disposition: A | Discharge status: Home / Self Care | Payer: Medicare Other | Source: Ambulatory Visit | Attending: Internal Medicine | Admitting: Internal Medicine

## 2016-10-07 DIAGNOSIS — R161 Splenomegaly, not elsewhere classified: Secondary | ICD-10-CM | POA: Diagnosis present

## 2016-10-07 DIAGNOSIS — C8307 Small cell B-cell lymphoma, spleen: Secondary | ICD-10-CM | POA: Insufficient documentation

## 2016-11-05 LAB — CUP PACEART INCLINIC DEVICE CHECK
Battery Impedance: 4252 Ohm
Battery Remaining Longevity: 9 mo
Battery Voltage: 2.67 V
Brady Statistic AP VP Percent: 1 %
Brady Statistic AP VS Percent: 88 %
Brady Statistic AS VP Percent: 0 %
Brady Statistic AS VS Percent: 10 %
Date Time Interrogation Session: 20171212150614
Implantable Lead Implant Date: 20080930
Implantable Lead Implant Date: 20080930
Implantable Lead Location: 753859
Implantable Lead Location: 753860
Implantable Lead Model: 5076
Implantable Lead Model: 5076
Implantable Pulse Generator Implant Date: 20080930
Lead Channel Impedance Value: 466 Ohm
Lead Channel Impedance Value: 473 Ohm
Lead Channel Pacing Threshold Amplitude: 0.625 V
Lead Channel Pacing Threshold Amplitude: 0.875 V
Lead Channel Pacing Threshold Pulse Width: 0.4 ms
Lead Channel Pacing Threshold Pulse Width: 0.4 ms
Lead Channel Sensing Intrinsic Amplitude: 16 mV
Lead Channel Setting Pacing Amplitude: 1.5 V
Lead Channel Setting Pacing Amplitude: 2 V
Lead Channel Setting Pacing Pulse Width: 0.4 ms
Lead Channel Setting Sensing Sensitivity: 5.6 mV

## 2016-12-29 ENCOUNTER — Other Ambulatory Visit (HOSPITAL_BASED_OUTPATIENT_CLINIC_OR_DEPARTMENT_OTHER): Payer: Medicare Other

## 2016-12-29 ENCOUNTER — Encounter: Payer: Self-pay | Admitting: Internal Medicine

## 2016-12-29 ENCOUNTER — Ambulatory Visit (HOSPITAL_BASED_OUTPATIENT_CLINIC_OR_DEPARTMENT_OTHER): Payer: Medicare Other | Admitting: Internal Medicine

## 2016-12-29 ENCOUNTER — Telehealth: Payer: Self-pay | Admitting: Internal Medicine

## 2016-12-29 VITALS — BP 140/74 | HR 87 | Temp 97.8°F | Resp 18 | Ht 71.5 in | Wt 197.4 lb

## 2016-12-29 DIAGNOSIS — D649 Anemia, unspecified: Secondary | ICD-10-CM

## 2016-12-29 DIAGNOSIS — C8307 Small cell B-cell lymphoma, spleen: Secondary | ICD-10-CM

## 2016-12-29 DIAGNOSIS — D696 Thrombocytopenia, unspecified: Secondary | ICD-10-CM

## 2016-12-29 DIAGNOSIS — D72829 Elevated white blood cell count, unspecified: Secondary | ICD-10-CM

## 2016-12-29 DIAGNOSIS — D471 Chronic myeloproliferative disease: Secondary | ICD-10-CM | POA: Diagnosis not present

## 2016-12-29 LAB — COMPREHENSIVE METABOLIC PANEL
ALT: 12 U/L (ref 0–55)
ANION GAP: 6 meq/L (ref 3–11)
AST: 21 U/L (ref 5–34)
Albumin: 3.6 g/dL (ref 3.5–5.0)
Alkaline Phosphatase: 94 U/L (ref 40–150)
BILIRUBIN TOTAL: 0.41 mg/dL (ref 0.20–1.20)
BUN: 33.7 mg/dL — ABNORMAL HIGH (ref 7.0–26.0)
CALCIUM: 9.1 mg/dL (ref 8.4–10.4)
CO2: 26 meq/L (ref 22–29)
Chloride: 110 mEq/L — ABNORMAL HIGH (ref 98–109)
Creatinine: 1.3 mg/dL (ref 0.7–1.3)
EGFR: 48 mL/min/{1.73_m2} — AB (ref >90)
Glucose: 87 mg/dl (ref 70–140)
Potassium: 5.1 mEq/L (ref 3.5–5.1)
Sodium: 142 mEq/L (ref 136–145)
TOTAL PROTEIN: 6.2 g/dL — AB (ref 6.4–8.3)

## 2016-12-29 LAB — CBC WITH DIFFERENTIAL/PLATELET
BASO%: 0.1 % (ref 0.0–2.0)
Basophils Absolute: 0 10*3/uL (ref 0.0–0.1)
EOS%: 2 % (ref 0.0–7.0)
Eosinophils Absolute: 0.3 10*3/uL (ref 0.0–0.5)
HCT: 34.2 % — ABNORMAL LOW (ref 38.4–49.9)
HGB: 11.1 g/dL — ABNORMAL LOW (ref 13.0–17.1)
LYMPH%: 73.9 % — ABNORMAL HIGH (ref 14.0–49.0)
MCH: 32.2 pg (ref 27.2–33.4)
MCHC: 32.5 g/dL (ref 32.0–36.0)
MCV: 98.9 fL — ABNORMAL HIGH (ref 79.3–98.0)
MONO#: 0.7 10*3/uL (ref 0.1–0.9)
MONO%: 4.2 % (ref 0.0–14.0)
NEUT#: 3.2 10*3/uL (ref 1.5–6.5)
NEUT%: 19.8 % — ABNORMAL LOW (ref 39.0–75.0)
Platelets: 117 10*3/uL — ABNORMAL LOW (ref 140–400)
RBC: 3.46 10*6/uL — ABNORMAL LOW (ref 4.20–5.82)
RDW: 14 % (ref 11.0–14.6)
WBC: 16.1 10*3/uL — ABNORMAL HIGH (ref 4.0–10.3)
lymph#: 11.9 10*3/uL — ABNORMAL HIGH (ref 0.9–3.3)

## 2016-12-29 LAB — LACTATE DEHYDROGENASE: LDH: 215 U/L (ref 125–245)

## 2016-12-29 LAB — TECHNOLOGIST REVIEW

## 2016-12-29 NOTE — Telephone Encounter (Signed)
Gave patient AVS and calender per 12/29/2016 los

## 2016-12-29 NOTE — Progress Notes (Signed)
[facility] [phone]   [phone]  OFFICE PROGRESS NOTE  Jerlyn Ly, MD [place] [address]  DIAGNOSIS: lymphoproliferative disorder suspicious for splenic marginal zone lymphoma.  PRIOR THERAPY: None  CURRENT THERAPY: Observation.  INTERVAL HISTORY: David [name] 83 y.o. male came to the clinic today for follow-up visit accompanied by his wife. The patient is feeling fine today with no specific complaints. He denied having any chest pain, shortness of breath except with exertion, cough or hemoptysis. He has no weight loss or night sweats. He denied having any fever or chills. He has no nausea, vomiting, diarrhea or constipation. He is scheduled to have a cataract surgery next week. He had ultrasound of the abdomen recently as well as blood work earlier today and he is here for evaluation and discussion of his lab results.  MEDICAL HISTORY: Past Medical History:  Diagnosis Date  . Dyslipidemia   . Dyslipidemia   . Kyphosis   . Leukocytosis 03/30/2016  . Lymphocytosis 03/30/2016  . Nonsustained ventricular tachycardia ([place])   . Osteoporosis    Severe  . Pacemaker 2008  . Paroxysmal atrial fibrillation (HCC)   . Pulmonary embolism ([place])    following a hip fracture and surgery. 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 Sinus node dysfunction (HCC)   . 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 Tachycardia-bradycardia syndrome ([place]) 2008  . Venous thrombosis    following a hip fracture and surgery. He has an inferior vena cava filter in place and on chronic warfarin anticoagulation    ALLERGIES:  has No Known Allergies.  MEDICATIONS:  Current Outpatient Prescriptions  Medication Sig Dispense Refill  . alfuzosin (UROXATRAL) 10 MG 24 hr tablet Take 10 mg by mouth every other day.  0  . allopurinol (ZYLOPRIM) 100 MG tablet Take 1 tablet by mouth daily.  0   . BESIVANCE 0.6 % SUSP instill 1 drop into right eye four times a day STARTING 2 DAYS BE...  (REFER TO PRESCRIPTION NOTES).  0  . Calcium-Vitamin D-Vitamin K (CALCIUM + D + K PO) Take 500 mg by mouth daily.    . Cholecalciferol (VITAMIN D) 2000 UNITS tablet Take 2,000 Units by mouth daily.    . Coenzyme Q10 (CO Q-10) 100 MG CAPS Take 1 capsule by mouth daily.    . DUREZOL 0.05 % EMUL instill 1 drop into right eye four times a day STARTING AFTER SURGERY as directed by prescriber  0  . EPINEPHrine 0.3 mg/0.3 mL IJ SOAJ injection Inject into the muscle as needed.    . fexofenadine (ALLEGRA) 180 MG tablet Take 180 mg by mouth daily.    . hydrocodone-acetaminophen (LORCET-HD) 5-500 MG per capsule Take 1 capsule by mouth as needed for pain.    Marland Kitchen levothyroxine (SYNTHROID, LEVOTHROID) 25 MCG tablet Take 25 mcg by mouth daily.  1  . Multiple Vitamins-Minerals (PRESERVISION AREDS) CAPS Take 2 capsules by mouth daily.    Marland Kitchen SALINE NASAL SPRAY NA Place into the nose as needed.    . simvastatin (ZOCOR) 40 MG tablet Take 40 mg by mouth daily.  1  . vitamin B-12 (CYANOCOBALAMIN) 1000 MCG tablet Take 1,000 mcg by mouth daily.    Marland Kitchen warfarin (COUMADIN) 5 MG tablet Take 1 tablet by mouth daily. AS PER INR  0   No current facility-administered medications for this visit.     SURGICAL  HISTORY:  Past Surgical History:  Procedure Laterality Date  . PACEMAKER INSERTION     Medtronic Adapta model #ADDRO1, serial [id number]  . TOTAL HIP ARTHROPLASTY  2004    REVIEW OF SYSTEMS:  A comprehensive review of systems was negative except for: Constitutional: positive for fatigue   PHYSICAL EXAMINATION: General appearance: alert, cooperative, fatigued and no distress Head: Normocephalic, without obvious abnormality, atraumatic Neck: no adenopathy, no JVD, supple, symmetrical, trachea midline and thyroid not enlarged, symmetric, no tenderness/mass/nodules Lymph nodes: Cervical, supraclavicular, and axillary nodes  normal. Resp: clear to auscultation bilaterally Back: symmetric, no curvature. ROM normal. No CVA tenderness. Cardio: regular rate and rhythm, S1, S2 normal, no murmur, click, rub or gallop GI: soft, non-tender; bowel sounds normal; no masses,  no organomegaly Extremities: extremities normal, atraumatic, no cyanosis or edema  ECOG PERFORMANCE STATUS: 1 - Symptomatic but completely ambulatory  Blood pressure 140/74, pulse 87, temperature 97.8 F (36.6 C), temperature source Oral, resp. rate 18, height 5' 11.5" (1.816 m), weight 197 lb 6.4 oz (89.5 kg), SpO2 97 %.  LABORATORY DATA: Lab Results  Component Value Date   WBC 16.1 (H) 12/29/2016   HGB 11.1 (L) 12/29/2016   HCT 34.2 (L) 12/29/2016   MCV 98.9 (H) 12/29/2016   PLT 117 (L) 12/29/2016      Chemistry      Component Value Date/Time   NA 142 09/29/2016 1035   K 4.2 09/29/2016 1035   CO2 24 09/29/2016 1035   BUN 30.5 (H) 09/29/2016 1035   CREATININE 1.3 09/29/2016 1035      Component Value Date/Time   CALCIUM 9.0 09/29/2016 1035   ALKPHOS 70 09/29/2016 1035   AST 23 09/29/2016 1035   ALT 12 09/29/2016 1035   BILITOT 0.42 09/29/2016 1035       RADIOGRAPHIC STUDIES: No results found.  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 The patient is currently on observation on the recent ultrasound of the abdomen showed borderline enlarged splenomegaly and normal liver. His CBC today showed persistent leukocytosis as well as persistent mild anemia and thrombocytopenia. The patient is currently asymptomatic. I recommended for him to continue on observation with repeat CBC, comprehensive metabolic panel and LDH in 6 months. He was advised to call immediately if he has any concerning symptoms in the interval. The patient voices understanding of current disease status and treatment options and is in  agreement with the current care plan.  All questions were answered. The patient knows to call the clinic with any problems, questions or concerns. We can certainly see the patient much sooner if necessary. I spent 10 minutes counseling the patient face to face. The total time spent in the appointment was 15 minutes.  Disclaimer: This note was dictated with voice recognition software. Similar sounding words can inadvertently be transcribed and may not be corrected upon review.

## 2017-01-05 ENCOUNTER — Ambulatory Visit (INDEPENDENT_AMBULATORY_CARE_PROVIDER_SITE_OTHER): Payer: Medicare Other | Admitting: *Deleted

## 2017-01-05 ENCOUNTER — Telehealth: Payer: Self-pay | Admitting: Cardiology

## 2017-01-05 DIAGNOSIS — I495 Sick sinus syndrome: Secondary | ICD-10-CM

## 2017-01-05 NOTE — Telephone Encounter (Signed)
Spoke with pt and reminded pt of remote transmission that is due today. Pt verbalized understanding.   

## 2017-01-06 ENCOUNTER — Encounter: Payer: Self-pay | Admitting: Cardiology

## 2017-01-06 LAB — CUP PACEART REMOTE DEVICE CHECK
Battery Impedance: 4705 Ohm
Battery Remaining Longevity: 7 mo
Battery Voltage: 2.65 V
Brady Statistic AS VS Percent: 10 %
Date Time Interrogation Session: 20180313183441
Implantable Lead Implant Date: 20080930
Implantable Lead Location: 753859
Implantable Lead Model: 5076
Implantable Lead Model: 5076
Implantable Pulse Generator Implant Date: 20080930
Lead Channel Impedance Value: 512 Ohm
Lead Channel Pacing Threshold Amplitude: 0.5 V
Lead Channel Sensing Intrinsic Amplitude: 16 mV
Lead Channel Setting Pacing Amplitude: 1.5 V
Lead Channel Setting Pacing Amplitude: 2 V
Lead Channel Setting Pacing Pulse Width: 0.4 ms
Lead Channel Setting Sensing Sensitivity: 5.6 mV
MDC IDC LEAD IMPLANT DT: 20080930
MDC IDC LEAD LOCATION: 753860
MDC IDC MSMT LEADCHNL RA PACING THRESHOLD PULSEWIDTH: 0.4 ms
MDC IDC MSMT LEADCHNL RV IMPEDANCE VALUE: 505 Ohm
MDC IDC MSMT LEADCHNL RV PACING THRESHOLD AMPLITUDE: 0.875 V
MDC IDC MSMT LEADCHNL RV PACING THRESHOLD PULSEWIDTH: 0.4 ms
MDC IDC STAT BRADY AP VP PERCENT: 1 %
MDC IDC STAT BRADY AP VS PERCENT: 88 %
MDC IDC STAT BRADY AS VP PERCENT: 0 %

## 2017-01-06 NOTE — Progress Notes (Signed)
Remote pacemaker transmission.   

## 2017-04-06 ENCOUNTER — Ambulatory Visit (INDEPENDENT_AMBULATORY_CARE_PROVIDER_SITE_OTHER): Payer: Medicare Other | Admitting: *Deleted

## 2017-04-06 ENCOUNTER — Telehealth: Payer: Self-pay | Admitting: Cardiology

## 2017-04-06 DIAGNOSIS — I495 Sick sinus syndrome: Secondary | ICD-10-CM

## 2017-04-06 NOTE — Telephone Encounter (Signed)
Spoke with pt and reminded pt of remote transmission that is due today. Pt verbalized understanding.   

## 2017-04-07 ENCOUNTER — Encounter: Payer: Self-pay | Admitting: Cardiology

## 2017-04-07 NOTE — Progress Notes (Signed)
Remote pacemaker transmission.   

## 2017-04-08 LAB — CUP PACEART REMOTE DEVICE CHECK
Battery Impedance: 5761 Ohm
Battery Remaining Longevity: 1 mo
Battery Voltage: 2.62 V
Brady Statistic AP VS Percent: 89 %
Implantable Lead Implant Date: 20080930
Implantable Lead Location: 753859
Implantable Lead Model: 5076
Implantable Lead Model: 5076
Implantable Pulse Generator Implant Date: 20080930
Lead Channel Pacing Threshold Amplitude: 0.5 V
Lead Channel Pacing Threshold Pulse Width: 0.4 ms
Lead Channel Pacing Threshold Pulse Width: 0.4 ms
Lead Channel Setting Pacing Amplitude: 1.5 V
Lead Channel Setting Pacing Amplitude: 2.25 V
Lead Channel Setting Pacing Pulse Width: 0.4 ms
Lead Channel Setting Sensing Sensitivity: 5.6 mV
MDC IDC LEAD IMPLANT DT: 20080930
MDC IDC LEAD LOCATION: 753860
MDC IDC MSMT LEADCHNL RA IMPEDANCE VALUE: 526 Ohm
MDC IDC MSMT LEADCHNL RV IMPEDANCE VALUE: 487 Ohm
MDC IDC MSMT LEADCHNL RV PACING THRESHOLD AMPLITUDE: 1.125 V
MDC IDC SESS DTM: 20180612172654
MDC IDC STAT BRADY AP VP PERCENT: 1 %
MDC IDC STAT BRADY AS VP PERCENT: 0 %
MDC IDC STAT BRADY AS VS PERCENT: 10 %

## 2017-05-06 ENCOUNTER — Ambulatory Visit (INDEPENDENT_AMBULATORY_CARE_PROVIDER_SITE_OTHER): Payer: Medicare Other | Admitting: *Deleted

## 2017-05-06 ENCOUNTER — Telehealth: Payer: Self-pay | Admitting: Cardiology

## 2017-05-06 DIAGNOSIS — Z950 Presence of cardiac pacemaker: Secondary | ICD-10-CM

## 2017-05-06 NOTE — Telephone Encounter (Signed)
LMOVM reminding pt to send remote transmission.   

## 2017-05-06 NOTE — Progress Notes (Signed)
Remote pacemaker transmission.   

## 2017-05-11 ENCOUNTER — Encounter: Payer: Self-pay | Admitting: Cardiology

## 2017-06-02 LAB — CUP PACEART REMOTE DEVICE CHECK
Battery Impedance: 5997 Ohm
Battery Remaining Longevity: 1 mo — CL
Battery Voltage: 2.61 V
Brady Statistic AP VP Percent: 1 %
Date Time Interrogation Session: 20180712175317
Implantable Lead Implant Date: 20080930
Implantable Lead Implant Date: 20080930
Implantable Lead Location: 753859
Implantable Lead Model: 5076
Implantable Pulse Generator Implant Date: 20080930
Lead Channel Impedance Value: 502 Ohm
Lead Channel Setting Pacing Amplitude: 1.5 V
Lead Channel Setting Pacing Amplitude: 2.25 V
Lead Channel Setting Pacing Pulse Width: 0.4 ms
MDC IDC LEAD LOCATION: 753860
MDC IDC MSMT LEADCHNL RA PACING THRESHOLD AMPLITUDE: 0.625 V
MDC IDC MSMT LEADCHNL RA PACING THRESHOLD PULSEWIDTH: 0.4 ms
MDC IDC MSMT LEADCHNL RV IMPEDANCE VALUE: 476 Ohm
MDC IDC MSMT LEADCHNL RV PACING THRESHOLD AMPLITUDE: 1.125 V
MDC IDC MSMT LEADCHNL RV PACING THRESHOLD PULSEWIDTH: 0.4 ms
MDC IDC SET LEADCHNL RV SENSING SENSITIVITY: 5.6 mV
MDC IDC STAT BRADY AP VS PERCENT: 89 %
MDC IDC STAT BRADY AS VP PERCENT: 0 %
MDC IDC STAT BRADY AS VS PERCENT: 10 %

## 2017-06-07 ENCOUNTER — Ambulatory Visit (INDEPENDENT_AMBULATORY_CARE_PROVIDER_SITE_OTHER): Payer: Self-pay | Admitting: *Deleted

## 2017-06-07 DIAGNOSIS — I495 Sick sinus syndrome: Secondary | ICD-10-CM

## 2017-06-07 NOTE — Progress Notes (Signed)
Remote pacemaker check. PPM [facility] 06/04/17, Mr. Bledsoe made aware.

## 2017-06-08 LAB — CUP PACEART REMOTE DEVICE CHECK
Date Time Interrogation Session: 20180813145329
Implantable Lead Implant Date: 20080930
Implantable Lead Location: 753860
Implantable Lead Model: 5076
Implantable Pulse Generator Implant Date: 20080930
Lead Channel Setting Pacing Amplitude: 2 V
Lead Channel Setting Pacing Pulse Width: 0.4 ms
Lead Channel Setting Sensing Sensitivity: 5.6 mV
MDC IDC LEAD IMPLANT DT: 20080930
MDC IDC LEAD LOCATION: 753859
MDC IDC MSMT BATTERY IMPEDANCE: 6847 Ohm
MDC IDC MSMT BATTERY VOLTAGE: 2.61 V
MDC IDC MSMT LEADCHNL RA IMPEDANCE VALUE: 67 Ohm
MDC IDC MSMT LEADCHNL RV IMPEDANCE VALUE: 510 Ohm
MDC IDC STAT BRADY RV PERCENT PACED: 1 %

## 2017-06-15 ENCOUNTER — Ambulatory Visit: Payer: Medicare Other | Admitting: Cardiovascular Disease

## 2017-06-21 ENCOUNTER — Telehealth: Payer: Self-pay | Admitting: Cardiovascular Disease

## 2017-06-21 NOTE — Telephone Encounter (Signed)
New message    Pt wants to speak with someone about changing the battery in his pacemaker.

## 2017-06-21 NOTE — Telephone Encounter (Signed)
Spoke with pt, pt agreeable to have device changed out of 06/30/2017 and pt voiced understanding that someone would be calling him to make an appointment with either Tommye Standard or Chanetta Marshall to discuss the generator change. Will route high priority to [employer] and [employer].

## 2017-06-21 NOTE — Telephone Encounter (Signed)
If we can get him an office appt with PA/NP or with Chanetta Marshall or Tommye Standard in the device clinic to discuss gen change, I can do it Sept 5th. Is he symptomatic? MCr

## 2017-06-22 NOTE — Telephone Encounter (Signed)
Pt wife notified(DPR) of Dr [employer] message, will take scheduled dosing

## 2017-06-22 NOTE — Telephone Encounter (Signed)
Appt made with pt, 06-24-17 with Chanetta Marshall @ 1240pm. Pt asking if he needs to d/c Coumadin prior to procedure?

## 2017-06-22 NOTE — Telephone Encounter (Signed)
No, continue warfarin, but we need to make sure INR is not excessively high (preferably under 2.5). Can check at office visit. MCr

## 2017-06-24 ENCOUNTER — Telehealth: Payer: Self-pay | Admitting: *Deleted

## 2017-06-24 ENCOUNTER — Ambulatory Visit (INDEPENDENT_AMBULATORY_CARE_PROVIDER_SITE_OTHER): Payer: Medicare Other | Admitting: Nurse Practitioner

## 2017-06-24 ENCOUNTER — Encounter: Payer: Self-pay | Admitting: Nurse Practitioner

## 2017-06-24 ENCOUNTER — Encounter: Payer: Self-pay | Admitting: Cardiovascular Disease

## 2017-06-24 ENCOUNTER — Telehealth: Payer: Self-pay | Admitting: Nurse Practitioner

## 2017-06-24 VITALS — BP 118/64 | HR 66 | Ht 71.0 in | Wt 199.0 lb

## 2017-06-24 DIAGNOSIS — I471 Supraventricular tachycardia: Secondary | ICD-10-CM | POA: Diagnosis not present

## 2017-06-24 DIAGNOSIS — I472 Ventricular tachycardia: Secondary | ICD-10-CM

## 2017-06-24 DIAGNOSIS — I495 Sick sinus syndrome: Secondary | ICD-10-CM

## 2017-06-24 DIAGNOSIS — I4729 Other ventricular tachycardia: Secondary | ICD-10-CM

## 2017-06-24 LAB — CUP PACEART INCLINIC DEVICE CHECK
Date Time Interrogation Session: 20180830114754
Implantable Lead Implant Date: 20080930
Implantable Lead Location: 753859
Implantable Lead Model: 5076
MDC IDC LEAD IMPLANT DT: 20080930
MDC IDC LEAD LOCATION: 753860
MDC IDC PG IMPLANT DT: 20080930

## 2017-06-24 NOTE — Progress Notes (Signed)
Thank you so much! MCr

## 2017-06-24 NOTE — Progress Notes (Signed)
Electrophysiology Office Note Date: 06/24/2017  ID:  David [name], DOB 1926/08/03, MRN [id number]  PCP: [name], MD Primary Cardiologist: Dr C  CC: Pacemaker at [facility] is a 83 y.o. male seen today for Dr C.  His pacemaker has been found to be at [facility] and he presents today to discuss pacemaker generator change.   Since last being seen in our clinic, the patient reports doing very well.  He denies chest pain, palpitations, dyspnea, PND, orthopnea, nausea, vomiting, dizziness, syncope, edema, weight gain, or early satiety.  Device History: MDT dual chamber PPM implanted 2008 for SSS   Past Medical History:  Diagnosis Date  . Dyslipidemia   . Kyphosis   . Nonsustained ventricular tachycardia ([place])   . Osteoporosis    Severe  . Paroxysmal atrial fibrillation (HCC)   . Pulmonary embolism ([place])    following a hip fracture and surgery. 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 Sinus node dysfunction (HCC)   . 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 Tachycardia-bradycardia syndrome ([place]) 2008   a. s/p MDT dual chamber PPM    Past Surgical History:  Procedure Laterality Date  . PACEMAKER INSERTION     Medtronic Adapta model #ADDRO1, serial [id number] H  . TOTAL HIP ARTHROPLASTY  2004    Current Outpatient Prescriptions  Medication Sig Dispense Refill  . alfuzosin (UROXATRAL) 10 MG 24 hr tablet Take 10 mg by mouth every other day.  0  . allopurinol (ZYLOPRIM) 100 MG tablet Take 1 tablet by mouth daily.  0  . Calcium-Vitamin D-Vitamin K (CALCIUM + D + K PO) Take 500 mg by mouth daily.    . Cholecalciferol (VITAMIN D) 2000 UNITS tablet Take 2,000 Units by mouth daily.    . Coenzyme Q10 (CO Q-10) 100 MG CAPS Take 1 capsule by mouth daily.    Marland Kitchen EPINEPHrine 0.3 mg/0.3 mL IJ SOAJ injection Inject into the muscle as needed.    . fexofenadine (ALLEGRA) 180 MG tablet Take 180 mg by mouth daily.    .  hydrocodone-acetaminophen (LORCET-HD) 5-500 MG per capsule Take 1 capsule by mouth as needed for pain.    Marland Kitchen levothyroxine (SYNTHROID, LEVOTHROID) 25 MCG tablet Take 25 mcg by mouth daily.  1  . Multiple Vitamins-Minerals (PRESERVISION AREDS) CAPS Take 2 capsules by mouth daily.    Marland Kitchen SALINE NASAL SPRAY NA Place into the nose as needed.    . simvastatin (ZOCOR) 40 MG tablet Take 40 mg by mouth daily.  1  . vitamin B-12 (CYANOCOBALAMIN) 1000 MCG tablet Take 1,000 mcg by mouth daily.    Marland Kitchen warfarin (COUMADIN) 5 MG tablet Take 1 tablet by mouth daily. AS PER INR  0   No current facility-administered medications for this visit.     Allergies:   Patient has no known allergies.   Social History: Social History   Social History  . Marital status: Married    Spouse name: N/A  . Number of children: N/A  . Years of education: N/A   Occupational History  . Not on file.   Social History Main Topics  . Smoking status: Former Smoker    Quit date: 09/25/1984  . Smokeless tobacco: Never Used  . Alcohol use Yes  . Drug use: No  . Sexual activity: No   Other Topics Concern  . Not on file   Social History Narrative  . No narrative on  file    Family History: Family History  Problem Relation Age of Onset  . Stroke Mother   . Heart attack Father   . Stroke Maternal Grandmother   . Stroke Maternal Grandfather   . Fainting Sister      Review of Systems: All other systems reviewed and are otherwise negative except as noted above.   Physical Exam: VS:  BP 118/64   Pulse 66   Ht 5\' 11"  (1.803 m)   Wt 199 lb (90.3 kg)   SpO2 98%   BMI 27.75 kg/m  , BMI Body mass index is 27.75 kg/m.  GEN- The patient is elderly appearing, alert and oriented x 3 today.   HEENT: normocephalic, atraumatic; sclera clear, conjunctiva pink; hearing intact; oropharynx clear; neck supple  Lungs- Clear to ausculation bilaterally, normal work of breathing.  No wheezes, rales, rhonchi Heart- Regular rate and  rhythm (paced) GI- soft, non-tender, non-distended, bowel sounds present  Extremities- no clubbing, cyanosis, or edema  MS- no significant deformity or atrophy Skin- warm and dry, no rash or lesion; PPM pocket well healed Psych- euthymic mood, full affect Neuro- strength and sensation are intact  PPM Interrogation- reviewed in detail today,  See PACEART report  EKG:  EKG is ordered today. The ekg ordered today shows V pacing at 65  Recent Labs: 12/29/2016: ALT 12; BUN 33.7; Creatinine 1.3; HGB 11.1; Platelets 117; Potassium 5.1; Sodium 142   Wt Readings from Last 3 Encounters:  06/24/17 199 lb (90.3 kg)  12/29/16 197 lb 6.4 oz (89.5 kg)  10/06/16 198 lb 12.8 oz (90.2 kg)     Other studies Reviewed: Additional studies/ records that were reviewed today include: Dr Lurline Del office notes  Assessment and Plan:  1.  Sick sinus syndrome PPM at [facility] Risks, benefits to gen change reviewed with patient who wishes to proceed. With prior VTE, will plan gen change on uninterrupted anticoagulation per Dr Lurline Del office note. INR with pre-procedure labs today. RV lead testing normal today, unable to check RA lead  2.  Atrial tachycardia/NSVT Asymptomatic   Current medicines are reviewed at length with the patient today.   The patient does not have concerns regarding his medicines.  The following changes were made today:  none  Labs/ tests ordered today include: pre-procedure labs Orders Placed This Encounter  Procedures  . CUP PACEART INCLINIC DEVICE CHECK     Disposition:   Follow up with Dr C after gen change    Signed, Chanetta Marshall, NP 06/24/2017 12:06 PM  [facility] [address] [place] [address] [phone] (office) [phone] (fax)

## 2017-06-24 NOTE — Telephone Encounter (Signed)
SPOKE TO PATIENT AND WAS TOLD NURSE HE WAS ASSISTED   WITH TODAY VISIT WOULD BE CONTACTED SO THAT ANTISEPTIC   SCRUB SOLUTION WILL BE UP AT THE FRONT DESK AREA FOR PICK UP BEFORE 06-30-17 PROCEDURE.

## 2017-06-24 NOTE — Patient Instructions (Addendum)
Medication Instructions:  Your physician recommends that you continue on your current medications as directed. Please refer to the Current Medication list given to you today.   Labwork: Your physician recommends that you return for lab work today for BMET, CBC, PT/INR   Follow-Up: Your physician recommends that you schedule a follow-up appointment in:    Your physician recommends that you schedule a follow-up appointment in: 3 months with Dr. Loletha Grayer  Any Other Special Instructions Will Be Listed Below (If Applicable).     If you need a refill on your cardiac medications before your next appointment, please call your pharmacy.

## 2017-06-24 NOTE — Telephone Encounter (Signed)
New message       Pt is having a procedure sept 5th.  He states that in his packet of papers, he is supposed to have an antiseptic solution presc.  He did not get it.  He saw [employer] today.  When will he get the antiseptic solution?  Is it ia presc or otc.  Please call

## 2017-06-25 LAB — CBC
HEMOGLOBIN: 11.3 g/dL — AB (ref 13.0–17.7)
Hematocrit: 33.7 % — ABNORMAL LOW (ref 37.5–51.0)
MCH: 33.3 pg — ABNORMAL HIGH (ref 26.6–33.0)
MCHC: 33.5 g/dL (ref 31.5–35.7)
MCV: 99 fL — ABNORMAL HIGH (ref 79–97)
PLATELETS: 120 10*3/uL — AB (ref 150–379)
RBC: 3.39 x10E6/uL — ABNORMAL LOW (ref 4.14–5.80)
RDW: 14.6 % (ref 12.3–15.4)
WBC: 22.6 10*3/uL — AB (ref 3.4–10.8)

## 2017-06-25 LAB — BASIC METABOLIC PANEL
BUN/Creatinine Ratio: 21 (ref 10–24)
BUN: 32 mg/dL — AB (ref 8–27)
CALCIUM: 8.9 mg/dL (ref 8.6–10.2)
CO2: 22 mmol/L (ref 20–29)
Chloride: 108 mmol/L — ABNORMAL HIGH (ref 96–106)
Creatinine, Ser: 1.56 mg/dL — ABNORMAL HIGH (ref 0.76–1.27)
GFR calc Af Amer: 45 mL/min/{1.73_m2} — ABNORMAL LOW (ref >59)
GFR calc non Af Amer: 39 mL/min/{1.73_m2} — ABNORMAL LOW (ref >59)
Glucose: 77 mg/dL (ref 65–99)
POTASSIUM: 5 mmol/L (ref 3.5–5.2)
Sodium: 143 mmol/L (ref 134–144)

## 2017-06-25 LAB — PROTIME-INR
INR: 2.4 — AB (ref 0.8–1.2)
PROTHROMBIN TIME: 23.5 s — AB (ref 9.1–12.0)

## 2017-06-25 NOTE — Telephone Encounter (Signed)
Surgical scrub was given to patient before he left office visit. Will place another bottle up front for patient. Patient advised.

## 2017-07-05 NOTE — Telephone Encounter (Signed)
Message sent to [facility] in Scheduling to reschedule and notify pt.

## 2017-07-05 NOTE — Telephone Encounter (Signed)
Pt is down for wound check on 07-12-17 and device change out has been changed from 06-30-17 to 07-07-17 does this need to be changed please call pt ./cy

## 2017-07-05 NOTE — Telephone Encounter (Signed)
New Message ° ° pt verbalized that he is returning call for rn  °

## 2017-07-07 ENCOUNTER — Ambulatory Visit (HOSPITAL_COMMUNITY)
Admission: RE | Admit: 2017-07-07 | Discharge: 2017-07-07 | Disposition: A | Discharge status: Home / Self Care | Payer: Medicare Other | Source: Ambulatory Visit | Attending: Cardiovascular Disease | Admitting: Cardiovascular Disease

## 2017-07-07 ENCOUNTER — Ambulatory Visit (HOSPITAL_COMMUNITY)
Admission: RE | Disposition: A | Discharge status: Home / Self Care | Payer: Self-pay | Source: Ambulatory Visit | Attending: Cardiovascular Disease

## 2017-07-07 DIAGNOSIS — Z7901 Long term (current) use of anticoagulants: Secondary | ICD-10-CM | POA: Diagnosis not present

## 2017-07-07 DIAGNOSIS — I495 Sick sinus syndrome: Secondary | ICD-10-CM

## 2017-07-07 DIAGNOSIS — I471 Supraventricular tachycardia: Secondary | ICD-10-CM | POA: Insufficient documentation

## 2017-07-07 DIAGNOSIS — Z79899 Other long term (current) drug therapy: Secondary | ICD-10-CM | POA: Insufficient documentation

## 2017-07-07 DIAGNOSIS — I480 Paroxysmal atrial fibrillation: Secondary | ICD-10-CM | POA: Diagnosis not present

## 2017-07-07 DIAGNOSIS — I472 Ventricular tachycardia: Secondary | ICD-10-CM | POA: Diagnosis not present

## 2017-07-07 DIAGNOSIS — I2782 Chronic pulmonary embolism: Secondary | ICD-10-CM | POA: Diagnosis not present

## 2017-07-07 DIAGNOSIS — Z96649 Presence of unspecified artificial hip joint: Secondary | ICD-10-CM | POA: Diagnosis not present

## 2017-07-07 DIAGNOSIS — E785 Hyperlipidemia, unspecified: Secondary | ICD-10-CM | POA: Diagnosis not present

## 2017-07-07 DIAGNOSIS — Z45010 Encounter for checking and testing of cardiac pacemaker pulse generator [battery]: Secondary | ICD-10-CM | POA: Insufficient documentation

## 2017-07-07 DIAGNOSIS — Z87891 Personal history of nicotine dependence: Secondary | ICD-10-CM | POA: Insufficient documentation

## 2017-07-07 HISTORY — PX: PPM GENERATOR CHANGEOUT: EP1233

## 2017-07-07 LAB — PROTIME-INR
INR: 2.29
PROTHROMBIN TIME: 25 s — AB (ref 11.4–15.2)

## 2017-07-07 LAB — SURGICAL PCR SCREEN
MRSA, PCR: NEGATIVE
STAPHYLOCOCCUS AUREUS: POSITIVE — AB

## 2017-07-07 SURGERY — PPM GENERATOR CHANGEOUT
Anesthesia: LOCAL

## 2017-07-07 MED ORDER — CEFAZOLIN SODIUM-DEXTROSE 2-4 GM/100ML-% IV SOLN
INTRAVENOUS | Status: AC
  Filled 2017-07-07: qty 100

## 2017-07-07 MED ORDER — SODIUM CHLORIDE 0.9% FLUSH: 3.0000 mL | Freq: Two times a day (BID) | INTRAVENOUS | Status: DC

## 2017-07-07 MED ORDER — ACETAMINOPHEN 325 MG PO TABS
325.0000 mg | ORAL_TABLET | ORAL | Status: DC | PRN
  Filled 2017-07-07: qty 2

## 2017-07-07 MED ORDER — CHLORHEXIDINE GLUCONATE 4 % EX LIQD: 60.0000 mL | Freq: Once | CUTANEOUS | Status: DC

## 2017-07-07 MED ORDER — SODIUM CHLORIDE 0.9 % IV SOLN: 250.0000 mL | INTRAVENOUS | Status: DC | PRN

## 2017-07-07 MED ORDER — LIDOCAINE HCL (PF) 1 % IJ SOLN
INTRAMUSCULAR | Status: AC
  Filled 2017-07-07: qty 30

## 2017-07-07 MED ORDER — GENTAMICIN SULFATE 40 MG/ML IJ SOLN
80.0000 mg | INTRAMUSCULAR | Status: AC
  Administered 2017-07-07: 80 mg

## 2017-07-07 MED ORDER — CEFAZOLIN SODIUM-DEXTROSE 2-4 GM/100ML-% IV SOLN
2.0000 g | INTRAVENOUS | Status: AC
  Administered 2017-07-07: 2 g via INTRAVENOUS

## 2017-07-07 MED ORDER — SODIUM CHLORIDE 0.9 % IR SOLN
Status: AC
  Filled 2017-07-07: qty 2

## 2017-07-07 MED ORDER — LIDOCAINE HCL (PF) 1 % IJ SOLN
INTRAMUSCULAR | Status: DC | PRN
  Administered 2017-07-07: 25 mL via SUBCUTANEOUS

## 2017-07-07 MED ORDER — SODIUM CHLORIDE 0.9 % IV SOLN
INTRAVENOUS | Status: DC
  Administered 2017-07-07: 12:00:00 via INTRAVENOUS

## 2017-07-07 MED ORDER — SODIUM CHLORIDE 0.9% FLUSH: 3.0000 mL | INTRAVENOUS | Status: DC | PRN

## 2017-07-07 MED ORDER — MUPIROCIN 2 % EX OINT
TOPICAL_OINTMENT | CUTANEOUS | Status: AC
  Administered 2017-07-07: 1
  Filled 2017-07-07: qty 22

## 2017-07-07 MED ORDER — ONDANSETRON HCL 4 MG/2ML IJ SOLN: 4.0000 mg | Freq: Four times a day (QID) | INTRAMUSCULAR | Status: DC | PRN

## 2017-07-07 SURGICAL SUPPLY — 10 items
CABLE SURGICAL S-101-97-12 (CABLE) ×2 IMPLANT
IPG PACE AZUR XT DR MRI W1DR01 (Pacemaker) ×1 IMPLANT
KIT ESSENTIALS PG (KITS) IMPLANT
PACE AZURE XT DR MRI W1DR01 (Pacemaker) ×2 IMPLANT
PAD DEFIB LIFELINK (PAD) ×2 IMPLANT
SHEATH CLASSIC 7F (SHEATH) IMPLANT
SHEATH CLASSIC 8F (SHEATH) IMPLANT
SHEATH CLASSIC 9.5F (SHEATH) IMPLANT
SHEATH CLASSIC 9F (SHEATH) IMPLANT
TRAY PACEMAKER INSERTION (PACKS) ×2 IMPLANT

## 2017-07-07 NOTE — Op Note (Signed)
Procedure report  Procedure performed:  1. Dual chamber pacemaker generator changeout  Reason for procedure:  1. Device generator at elective replacement interval  2. SSS/tachy-brady sd. Procedure performed by:  Sanda Klein, MD  Complications:  None  Estimated blood loss:  <5 mL  Medications administered during procedure:  Ancef 2 g intravenously, lidocaine 1% 30 mL locally Device details:   New Generator Medtronic Azure XT DR model number [id number], serial number [id number] Right atrial lead (chronic) Medtronic [id number], serial [id number] (implanted 07/26/2007) Right ventricular lead (chronic)  Medtronic [id number], serial number [id number] (implanted 07/26/2007)  Explanted generator Medtronic Adapta [id number] (implanted 07/26/2007)  Procedure details:  After the risks and benefits of the procedure were discussed the patient provided informed consent. She was brought to the cardiac catheter lab in the fasting state. The patient was prepped and draped in usual sterile fashion. Local anesthesia with 1% lidocaine was administered to to the left infraclavicular area. A 5-6cm horizontal incision was made parallel with and 2-3 cm caudal to the left clavicle, in the area of an old scar. An older scar was seen closer to the left clavicle. Using minimal electrocautery and mostly sharp and blunt dissection the prepectoral pocket was opened carefully to avoid injury to the loops of chronic leads. Extensive dissection was not necessary. The device was explanted. The pocket was carefully inspected for hemostasis and flushed with copious amounts of antibiotic solution.  The leads were disconnected from the old generator and testing of the lead parameters later showed excellent values. The new generator was connected to the chronic leads, with appropriate pacing noted.   The entire system was then carefully inserted in the pocket with care been taking that the leads and device assumed a comfortable  position without pressure on the incision. Great care was taken that the leads be located deep to the generator. The pocket was then closed in layers using 2 layers of 2-0 Vicryl and cutaneous staples after which a sterile dressing was applied.   At the end of the procedure the following lead parameters were encountered:   Right atrial lead sensed P waves 3.2 mV, impedance 417 ohms, threshold 0.5V at 0.5 ms pulse width.  Right ventricular lead sensed R waves  18 mV, impedance 502 ohms, threshold 1.0V at 0.5 ms pulse width.  Sanda Klein, MD, [facility] [phone] office [phone] pager

## 2017-07-07 NOTE — H&P (View-Only) (Signed)
Electrophysiology Office Note Date: 06/24/2017  ID:  David [name], DOB 1926-09-16, MRN [id number]  PCP: [name], MD Primary Cardiologist: Dr C  CC: Pacemaker at [facility] is a 83 y.o. male seen today for Dr C.  His pacemaker has been found to be at [facility] and he presents today to discuss pacemaker generator change.   Since last being seen in our clinic, the patient reports doing very well.  He denies chest pain, palpitations, dyspnea, PND, orthopnea, nausea, vomiting, dizziness, syncope, edema, weight gain, or early satiety.  Device History: MDT dual chamber PPM implanted 2008 for SSS   Past Medical History:  Diagnosis Date  . Dyslipidemia   . Kyphosis   . Nonsustained ventricular tachycardia ([place])   . Osteoporosis    Severe  . Paroxysmal atrial fibrillation (HCC)   . Pulmonary embolism ([place])    following a hip fracture and surgery. 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 Sinus node dysfunction (HCC)   . 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 Tachycardia-bradycardia syndrome ([place]) 2008   a. s/p MDT dual chamber PPM    Past Surgical History:  Procedure Laterality Date  . PACEMAKER INSERTION     Medtronic Adapta model #ADDRO1, serial [id number] H  . TOTAL HIP ARTHROPLASTY  2004    Current Outpatient Prescriptions  Medication Sig Dispense Refill  . alfuzosin (UROXATRAL) 10 MG 24 hr tablet Take 10 mg by mouth every other day.  0  . allopurinol (ZYLOPRIM) 100 MG tablet Take 1 tablet by mouth daily.  0  . Calcium-Vitamin D-Vitamin K (CALCIUM + D + K PO) Take 500 mg by mouth daily.    . Cholecalciferol (VITAMIN D) 2000 UNITS tablet Take 2,000 Units by mouth daily.    . Coenzyme Q10 (CO Q-10) 100 MG CAPS Take 1 capsule by mouth daily.    Marland Kitchen EPINEPHrine 0.3 mg/0.3 mL IJ SOAJ injection Inject into the muscle as needed.    . fexofenadine (ALLEGRA) 180 MG tablet Take 180 mg by mouth daily.    .  hydrocodone-acetaminophen (LORCET-HD) 5-500 MG per capsule Take 1 capsule by mouth as needed for pain.    Marland Kitchen levothyroxine (SYNTHROID, LEVOTHROID) 25 MCG tablet Take 25 mcg by mouth daily.  1  . Multiple Vitamins-Minerals (PRESERVISION AREDS) CAPS Take 2 capsules by mouth daily.    Marland Kitchen SALINE NASAL SPRAY NA Place into the nose as needed.    . simvastatin (ZOCOR) 40 MG tablet Take 40 mg by mouth daily.  1  . vitamin B-12 (CYANOCOBALAMIN) 1000 MCG tablet Take 1,000 mcg by mouth daily.    Marland Kitchen warfarin (COUMADIN) 5 MG tablet Take 1 tablet by mouth daily. AS PER INR  0   No current facility-administered medications for this visit.     Allergies:   Patient has no known allergies.   Social History: Social History   Social History  . Marital status: Married    Spouse name: N/A  . Number of children: N/A  . Years of education: N/A   Occupational History  . Not on file.   Social History Main Topics  . Smoking status: Former Smoker    Quit date: 09/25/1984  . Smokeless tobacco: Never Used  . Alcohol use Yes  . Drug use: No  . Sexual activity: No   Other Topics Concern  . Not on file   Social History Narrative  . No narrative on  file    Family History: Family History  Problem Relation Age of Onset  . Stroke Mother   . Heart attack Father   . Stroke Maternal Grandmother   . Stroke Maternal Grandfather   . Fainting Sister      Review of Systems: All other systems reviewed and are otherwise negative except as noted above.   Physical Exam: VS:  BP 118/64   Pulse 66   Ht 5\' 11"  (1.803 m)   Wt 199 lb (90.3 kg)   SpO2 98%   BMI 27.75 kg/m  , BMI Body mass index is 27.75 kg/m.  GEN- The patient is elderly appearing, alert and oriented x 3 today.   HEENT: normocephalic, atraumatic; sclera clear, conjunctiva pink; hearing intact; oropharynx clear; neck supple  Lungs- Clear to ausculation bilaterally, normal work of breathing.  No wheezes, rales, rhonchi Heart- Regular rate and  rhythm (paced) GI- soft, non-tender, non-distended, bowel sounds present  Extremities- no clubbing, cyanosis, or edema  MS- no significant deformity or atrophy Skin- warm and dry, no rash or lesion; PPM pocket well healed Psych- euthymic mood, full affect Neuro- strength and sensation are intact  PPM Interrogation- reviewed in detail today,  See PACEART report  EKG:  EKG is ordered today. The ekg ordered today shows V pacing at 65  Recent Labs: 12/29/2016: ALT 12; BUN 33.7; Creatinine 1.3; HGB 11.1; Platelets 117; Potassium 5.1; Sodium 142   Wt Readings from Last 3 Encounters:  06/24/17 199 lb (90.3 kg)  12/29/16 197 lb 6.4 oz (89.5 kg)  10/06/16 198 lb 12.8 oz (90.2 kg)     Other studies Reviewed: Additional studies/ records that were reviewed today include: Dr Lurline Del office notes  Assessment and Plan:  1.  Sick sinus syndrome PPM at [facility] Risks, benefits to gen change reviewed with patient who wishes to proceed. With prior VTE, will plan gen change on uninterrupted anticoagulation per Dr Lurline Del office note. INR with pre-procedure labs today. RV lead testing normal today, unable to check RA lead  2.  Atrial tachycardia/NSVT Asymptomatic   Current medicines are reviewed at length with the patient today.   The patient does not have concerns regarding his medicines.  The following changes were made today:  none  Labs/ tests ordered today include: pre-procedure labs Orders Placed This Encounter  Procedures  . CUP PACEART INCLINIC DEVICE CHECK     Disposition:   Follow up with Dr C after gen change    Signed, Chanetta Marshall, NP 06/24/2017 12:06 PM  [place] [address] [place] [address] [phone] (office) [phone] (fax)

## 2017-07-07 NOTE — Interval H&P Note (Signed)
History and Physical Interval Note:  07/07/2017 12:03 PM  David [name]  has presented today for surgery, with the diagnosis of ppm eri  The various methods of treatment have been discussed with the patient and family. After consideration of risks, benefits and other options for treatment, the patient has consented to  Procedure(s): PPM GENERATOR CHANGEOUT (N/A) as a surgical intervention .  The patient's history has been reviewed, patient examined, no change in status, stable for surgery.  I have reviewed the patient's chart and labs.  Questions were answered to the patient's satisfaction.     Gianella Chismar

## 2017-07-07 NOTE — Discharge Instructions (Signed)
Supplemental Discharge Instructions for  Pacemaker/Defibrillator Patients   MUPIROCIN INSTRUCTION SHEET GIVEN  Activity No restrictions. DO wear your seatbelt, even if it crosses over the pacemaker site.  WOUND CARE - Keep the wound area clean and dry.  Remove the dressing the day after you return home (usually 48 hours after the procedure). - DO NOT SUBMERGE UNDER WATER UNTIL FULLY HEALED (no tub baths, hot tubs, swimming pools, etc.).  - You  may shower or take a sponge bath after the dressing is removed. DO NOT SOAK the area and do not allow the shower to directly spray on the site. - If you have staples, these will be removed in the office in 7-14 days. - If you have tape/steri-strips on your wound, these will fall off; do not pull them off prematurely.   - No bandage is needed on the site.  DO  NOT apply any creams, oils, or ointments to the wound area. - If you notice any drainage or discharge from the wound, any swelling, excessive redness or bruising at the site, or if you develop a fever > 101? F after you are discharged home, call the office at once.  Special Instructions - You are still able to use cellular telephones.  Avoid carrying your cellular phone near your device. - When traveling through airports, show security personnel your identification card to avoid being screened in the metal detectors.  - Avoid arc welding equipment, MRI testing (magnetic resonance imaging), TENS units (transcutaneous nerve stimulators).  Call the office for questions about other devices. - Avoid electrical appliances that are in poor condition or are not properly grounded. - Microwave ovens are safe to be near or to operate.

## 2017-07-08 ENCOUNTER — Encounter (HOSPITAL_COMMUNITY): Payer: Self-pay | Admitting: Cardiovascular Disease

## 2017-07-08 ENCOUNTER — Telehealth: Payer: Self-pay | Admitting: Internal Medicine

## 2017-07-08 ENCOUNTER — Other Ambulatory Visit (HOSPITAL_BASED_OUTPATIENT_CLINIC_OR_DEPARTMENT_OTHER): Payer: Medicare Other

## 2017-07-08 ENCOUNTER — Ambulatory Visit (HOSPITAL_BASED_OUTPATIENT_CLINIC_OR_DEPARTMENT_OTHER): Payer: Medicare Other | Admitting: Internal Medicine

## 2017-07-08 VITALS — BP 108/43 | HR 63 | Temp 97.8°F | Resp 18 | Ht 70.0 in | Wt 198.6 lb

## 2017-07-08 DIAGNOSIS — D471 Chronic myeloproliferative disease: Secondary | ICD-10-CM

## 2017-07-08 DIAGNOSIS — D649 Anemia, unspecified: Secondary | ICD-10-CM | POA: Diagnosis not present

## 2017-07-08 DIAGNOSIS — D72829 Elevated white blood cell count, unspecified: Secondary | ICD-10-CM | POA: Diagnosis not present

## 2017-07-08 DIAGNOSIS — Z950 Presence of cardiac pacemaker: Secondary | ICD-10-CM | POA: Diagnosis not present

## 2017-07-08 DIAGNOSIS — C8307 Small cell B-cell lymphoma, spleen: Secondary | ICD-10-CM

## 2017-07-08 DIAGNOSIS — D696 Thrombocytopenia, unspecified: Secondary | ICD-10-CM | POA: Diagnosis not present

## 2017-07-08 LAB — CBC WITH DIFFERENTIAL/PLATELET
BASO%: 0.2 % (ref 0.0–2.0)
Basophils Absolute: 0 10*3/uL (ref 0.0–0.1)
EOS%: 1.8 % (ref 0.0–7.0)
Eosinophils Absolute: 0.3 10*3/uL (ref 0.0–0.5)
HCT: 33.7 % — ABNORMAL LOW (ref 38.4–49.9)
HEMOGLOBIN: 11.1 g/dL — AB (ref 13.0–17.1)
LYMPH#: 12.9 10*3/uL — AB (ref 0.9–3.3)
LYMPH%: 74.5 % — ABNORMAL HIGH (ref 14.0–49.0)
MCH: 33.2 pg (ref 27.2–33.4)
MCHC: 32.9 g/dL (ref 32.0–36.0)
MCV: 100.8 fL — AB (ref 79.3–98.0)
MONO#: 0.6 10*3/uL (ref 0.1–0.9)
MONO%: 3.2 % (ref 0.0–14.0)
NEUT%: 20.3 % — ABNORMAL LOW (ref 39.0–75.0)
NEUTROS ABS: 3.5 10*3/uL (ref 1.5–6.5)
PLATELETS: 113 10*3/uL — AB (ref 140–400)
RBC: 3.35 10*6/uL — ABNORMAL LOW (ref 4.20–5.82)
RDW: 14.8 % — AB (ref 11.0–14.6)
WBC: 17.4 10*3/uL — ABNORMAL HIGH (ref 4.0–10.3)

## 2017-07-08 LAB — COMPREHENSIVE METABOLIC PANEL
ALBUMIN: 3.5 g/dL (ref 3.5–5.0)
ALK PHOS: 60 U/L (ref 40–150)
ALT: 7 U/L (ref 0–55)
ANION GAP: 7 meq/L (ref 3–11)
AST: 19 U/L (ref 5–34)
BILIRUBIN TOTAL: 0.44 mg/dL (ref 0.20–1.20)
BUN: 29 mg/dL — ABNORMAL HIGH (ref 7.0–26.0)
CO2: 23 mEq/L (ref 22–29)
CREATININE: 1.6 mg/dL — AB (ref 0.7–1.3)
Calcium: 8.9 mg/dL (ref 8.4–10.4)
Chloride: 110 mEq/L — ABNORMAL HIGH (ref 98–109)
EGFR: 38 mL/min/{1.73_m2} — AB (ref >90)
GLUCOSE: 121 mg/dL (ref 70–140)
Potassium: 4.3 mEq/L (ref 3.5–5.1)
Sodium: 140 mEq/L (ref 136–145)
Total Protein: 6.2 g/dL — ABNORMAL LOW (ref 6.4–8.3)

## 2017-07-08 LAB — TECHNOLOGIST REVIEW

## 2017-07-08 LAB — LACTATE DEHYDROGENASE: LDH: 206 U/L (ref 125–245)

## 2017-07-08 MED FILL — Gentamicin Sulfate Inj 40 MG/ML: INTRAMUSCULAR | Qty: 2 | Status: AC

## 2017-07-08 MED FILL — Sodium Chloride Irrigation Soln 0.9%: Qty: 500 | Status: AC

## 2017-07-08 NOTE — Progress Notes (Signed)
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  PRIOR THERAPY: None  CURRENT THERAPY: Observation.  INTERVAL HISTORY: David [name] 83 y.o. male returns to the clinic today for follow-up visit accompanied by his wife. The patient is feeling fine today with no specific complaints. He has a change of the pacemaker yesterday and he tolerated the procedure well. He denied having any chest pain, shortness of breath, cough or hemoptysis. He denied having any recent weight loss or night sweats. He has no nausea, vomiting, diarrhea or constipation. He has no fever or chills. He is here today for evaluation and repeat blood work.   MEDICAL HISTORY: Past Medical History:  Diagnosis Date  . Dyslipidemia   . Kyphosis   . Nonsustained ventricular tachycardia ([place])   . Osteoporosis    Severe  . Paroxysmal atrial fibrillation (HCC)   . Pulmonary embolism ([place])    following a hip fracture and surgery. 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 Sinus node dysfunction (HCC)   . 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 Tachycardia-bradycardia syndrome ([place]) 2008   a. s/p MDT dual chamber PPM     ALLERGIES:  is allergic to bee venom.  MEDICATIONS:  Current Outpatient Prescriptions  Medication Sig Dispense Refill  . acetaminophen (TYLENOL) 325 MG tablet Take 325-650 mg by mouth daily as needed for moderate pain.    Marland Kitchen alfuzosin (UROXATRAL) 10 MG 24 hr tablet Take 10 mg by mouth every other day.  0  . allopurinol (ZYLOPRIM) 100 MG tablet Take 100 mg by mouth daily.   0  . Calcium-Vitamin D-Vitamin K (CALCIUM + D + K PO) Take 500 mg by mouth daily.    . Cholecalciferol (VITAMIN D) 2000 UNITS tablet Take 2,000 Units by mouth daily.    .  Coenzyme Q10 (CO Q-10) 100 MG CAPS Take 100 mg by mouth daily.     Marland Kitchen EPINEPHrine 0.3 mg/0.3 mL IJ SOAJ injection Inject 0.3 mg into the muscle as needed (allergic reaction).     . fexofenadine (ALLEGRA) 180 MG tablet Take 180 mg by mouth daily as needed for allergies.     Marland Kitchen levothyroxine (SYNTHROID, LEVOTHROID) 25 MCG tablet Take 25 mcg by mouth daily.  1  . Multiple Vitamins-Minerals (PRESERVISION AREDS) CAPS Take 1 capsule by mouth 2 (two) times daily.     Marland Kitchen neomycin-bacitracin-polymyxin (NEOSPORIN) ointment Apply 1 application topically daily as needed for wound care.    . simvastatin (ZOCOR) 40 MG tablet Take 40 mg by mouth daily.  1  . sodium chloride (OCEAN) 0.65 % SOLN nasal spray Place 1 spray into both nostrils as needed for congestion.    . vitamin B-12 (CYANOCOBALAMIN) 1000 MCG tablet Take 1,000 mcg by mouth daily.    Marland Kitchen warfarin (COUMADIN) 5 MG tablet Take 5-7.5 mg by mouth daily. Take 5 mg daily except on Wed take 7.5 mg daily  0   No current facility-administered medications for this visit.     SURGICAL HISTORY:  Past Surgical History:  Procedure Laterality Date  . PACEMAKER INSERTION     Medtronic Adapta model #ADDRO1, serial [id number] H  . PPM GENERATOR CHANGEOUT N/A 07/07/2017   Procedure: PPM GENERATOR CHANGEOUT;  Surgeon: Sanda Klein, MD;  Location: [facility];  Service: Cardiovascular;  Laterality: N/A;  . TOTAL HIP ARTHROPLASTY  2004    REVIEW OF SYSTEMS:  A comprehensive review of systems was negative except for: Constitutional: positive for fatigue   PHYSICAL EXAMINATION: General appearance: alert, cooperative, fatigued and no distress Head: Normocephalic, without obvious abnormality, atraumatic Neck: no adenopathy, no JVD, supple, symmetrical, trachea midline and thyroid not enlarged, symmetric, no tenderness/mass/nodules Lymph nodes: Cervical, supraclavicular, and axillary nodes normal. Resp: clear to auscultation bilaterally Back: symmetric, no  curvature. ROM normal. No CVA tenderness. Cardio: regular rate and rhythm, S1, S2 normal, no murmur, click, rub or gallop GI: soft, non-tender; bowel sounds normal; no masses,  no organomegaly Extremities: extremities normal, atraumatic, no cyanosis or edema  ECOG PERFORMANCE STATUS: 1 - Symptomatic but completely ambulatory  Blood pressure (!) 108/43, pulse 63, temperature 97.8 F (36.6 C), temperature source Oral, resp. rate 18, height 5\' 10"  (1.778 m), weight 198 lb 9.6 oz (90.1 kg), SpO2 97 %.  LABORATORY DATA: Lab Results  Component Value Date   WBC 17.4 (H) 07/08/2017   HGB 11.1 (L) 07/08/2017   HCT 33.7 (L) 07/08/2017   MCV 100.8 (H) 07/08/2017   PLT 113 (L) 07/08/2017      Chemistry      Component Value Date/Time   NA 140 07/08/2017 1022   K 4.3 07/08/2017 1022   CL 108 (H) 06/24/2017 1219   CO2 23 07/08/2017 1022   BUN 29.0 (H) 07/08/2017 1022   CREATININE 1.6 (H) 07/08/2017 1022      Component Value Date/Time   CALCIUM 8.9 07/08/2017 1022   ALKPHOS 60 07/08/2017 1022   AST 19 07/08/2017 1022   ALT 7 07/08/2017 1022   BILITOT 0.44 07/08/2017 1022       RADIOGRAPHIC STUDIES: No results found.  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 The patient is feeling fine today with no specific complaints. His CBC and comprehensive metabolic panel are very similar to 6 months ago with persistent leukocytosis, mild anemia and thrombocytopenia. I recommended for the patient to continue on observation with repeat CBC, comprehensive metabolic panel and LDH in 6 months. He was advised to call immediately if he has any concerning symptoms in the interval. The patient voices understanding of current disease status and treatment options and is in agreement with the current care plan.  All questions were answered. The patient knows to call the clinic  with any problems, questions or concerns. We can certainly see the patient much sooner if necessary. I spent 10 minutes counseling the patient face to face. The total time spent in the appointment was 15 minutes.  Disclaimer: This note was dictated with voice recognition software. Similar sounding words can inadvertently be transcribed and may not be corrected upon review.

## 2017-07-08 NOTE — Telephone Encounter (Signed)
Scheduled appt per 9/13 los - Gave patient AVS and calender per los.  

## 2017-07-12 ENCOUNTER — Ambulatory Visit: Payer: Medicare Other

## 2017-07-19 ENCOUNTER — Ambulatory Visit (INDEPENDENT_AMBULATORY_CARE_PROVIDER_SITE_OTHER): Payer: Medicare Other | Admitting: *Deleted

## 2017-07-19 DIAGNOSIS — I495 Sick sinus syndrome: Secondary | ICD-10-CM | POA: Diagnosis not present

## 2017-07-19 NOTE — Progress Notes (Signed)
Wound check appointment. Staples removed. Wound without redness or edema. Incision edges approximated, wound well healed. Normal device function. Thresholds, sensing, and impedances consistent with implant measurements. Device programmed at chronic values s/p gen change. Histogram distribution appropriate for patient and level of activity. No mode switches or high ventricular rates noted. Patient educated about wound care. ROV 12/4 with [facility].

## 2017-07-27 ENCOUNTER — Other Ambulatory Visit: Payer: Self-pay | Admitting: Cardiovascular Disease

## 2017-09-28 ENCOUNTER — Encounter: Payer: Self-pay | Admitting: Cardiovascular Disease

## 2017-09-28 ENCOUNTER — Ambulatory Visit: Payer: Medicare Other | Admitting: Cardiovascular Disease

## 2017-09-28 VITALS — BP 154/78 | HR 85 | Ht 71.5 in | Wt 196.4 lb

## 2017-09-28 DIAGNOSIS — C8307 Small cell B-cell lymphoma, spleen: Secondary | ICD-10-CM

## 2017-09-28 DIAGNOSIS — I472 Ventricular tachycardia: Secondary | ICD-10-CM

## 2017-09-28 DIAGNOSIS — Z95828 Presence of other vascular implants and grafts: Secondary | ICD-10-CM | POA: Diagnosis not present

## 2017-09-28 DIAGNOSIS — I495 Sick sinus syndrome: Secondary | ICD-10-CM | POA: Diagnosis not present

## 2017-09-28 DIAGNOSIS — Z950 Presence of cardiac pacemaker: Secondary | ICD-10-CM

## 2017-09-28 DIAGNOSIS — M25551 Pain in right hip: Secondary | ICD-10-CM | POA: Diagnosis not present

## 2017-09-28 DIAGNOSIS — E7800 Pure hypercholesterolemia, unspecified: Secondary | ICD-10-CM

## 2017-09-28 DIAGNOSIS — I4719 Other supraventricular tachycardia: Secondary | ICD-10-CM

## 2017-09-28 DIAGNOSIS — I4729 Other ventricular tachycardia: Secondary | ICD-10-CM

## 2017-09-28 DIAGNOSIS — I471 Supraventricular tachycardia: Secondary | ICD-10-CM

## 2017-09-28 NOTE — Patient Instructions (Signed)

## 2017-09-28 NOTE — Progress Notes (Signed)
Cardiology Office Note    Date:  09/28/2017   ID:  David [name], DOB [birth year], MRN [id number]  PCP:  [name], MD  Cardiologist:   Sanda Klein, MD   Chief Complaint  Patient presents with  . Follow-up  After pacemaker generator change  History of Present Illness:  David [name] is a 83 y.o. male with sinus node dysfunction, paroxysmal atrial tachycardia and nonsustained ventricular tachycardia returning for follow-up. He has a dual-chamber permanent pacemaker (implanted 2008, generator change Medtronic Azure September 2018) and is here for his first follow-up since generator change.  Before the generator change out he used to complain of palpitations, he has not had any since then.  His pacemaker shows no episodes of atrial tachycardia or nonsustained VT since September.  He has 91.7% atrial pacing with a good atrial heart rate histograms and does not require ventricular pacing.  Battery longevity is estimated at 13.5 years.  Lead parameters are excellent.  Both the atrial and the ventricular lead are 5076, MRI compatible leads.  The patient specifically denies any chest pain at rest exertion, dyspnea at rest or with exertion, orthopnea, paroxysmal nocturnal dyspnea, syncope, palpitations, focal neurological deficits, intermittent claudication, lower extremity edema, unexplained weight gain, cough, hemoptysis or wheezing.   He has been troubled by a lot of pain in his right hip.  A few days ago he noticed a popping sensation in that area when he turned over in bed, but there was no pain at that time.  He has had worsening pain ever since.  He has not had any recent bleeding problems or falls. In the remote past she did have a subdural hematoma while on warfarin therapy for pulmonary embolism after knee replacement surgery. He still has an inferior vena cava filter in place and is on maintenance warfarin therapy. Followed by Dr. 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  Previous cardiac workup: Echo 2011, myocardial perfusion study 2007 have not shown evidence of structural cardiac illness.  Past Medical History:  Diagnosis Date  . Dyslipidemia   . Kyphosis   . Nonsustained ventricular tachycardia ([place])   . Osteoporosis    Severe  . Paroxysmal atrial fibrillation (HCC)   . Pulmonary embolism ([place])    following a hip fracture and surgery. 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 Sinus node dysfunction (HCC)   . 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 Tachycardia-bradycardia syndrome ([place]) 2008   a. s/p MDT dual chamber PPM     Past Surgical History:  Procedure Laterality Date  . PACEMAKER INSERTION     Medtronic Adapta model #ADDRO1, serial [id number] H  . PPM GENERATOR CHANGEOUT N/A 07/07/2017   Procedure: PPM GENERATOR CHANGEOUT;  Surgeon: Sanda Klein, MD;  Location: [facility];  Service: Cardiovascular;  Laterality: N/A;  . TOTAL HIP ARTHROPLASTY  2004    Current Medications: Outpatient Medications Prior to Visit  Medication Sig Dispense Refill  . acetaminophen (TYLENOL) 325 MG tablet Take 325-650 mg by mouth daily as needed for moderate pain.    Marland Kitchen alfuzosin (UROXATRAL) 10 MG 24 hr tablet Take 10 mg by mouth every other day.  0  . allopurinol (ZYLOPRIM) 100 MG tablet Take 100 mg by mouth daily.   0  . Calcium-Vitamin D-Vitamin K (CALCIUM + D + K PO) Take 500 mg by mouth daily.    . Cholecalciferol (VITAMIN D) 2000 UNITS  tablet Take 2,000 Units by mouth daily.    . Coenzyme Q10 (CO Q-10) 100 MG CAPS Take 100 mg by mouth daily.     Marland Kitchen EPINEPHrine 0.3 mg/0.3 mL IJ SOAJ injection Inject 0.3 mg into the muscle as needed (allergic reaction).     . fexofenadine (ALLEGRA) 180 MG tablet Take 180 mg by mouth daily as needed for allergies.     Marland Kitchen levothyroxine (SYNTHROID, LEVOTHROID) 25 MCG tablet Take 25 mcg by mouth daily.  1  . Multiple Vitamins-Minerals  (PRESERVISION AREDS) CAPS Take 1 capsule by mouth 2 (two) times daily.     Marland Kitchen neomycin-bacitracin-polymyxin (NEOSPORIN) ointment Apply 1 application topically daily as needed for wound care.    . simvastatin (ZOCOR) 40 MG tablet Take 40 mg by mouth daily.  1  . sodium chloride (OCEAN) 0.65 % SOLN nasal spray Place 1 spray into both nostrils as needed for congestion.    . vitamin B-12 (CYANOCOBALAMIN) 1000 MCG tablet Take 1,000 mcg by mouth daily.    Marland Kitchen warfarin (COUMADIN) 5 MG tablet Take 5-7.5 mg by mouth daily. Take 5 mg daily except on Wed take 7.5 mg daily  0   No facility-administered medications prior to visit.      Allergies:   Bee venom   Social History   Socioeconomic History  . Marital status: Married    Spouse name: None  . Number of children: None  . Years of education: None  . Highest education level: None  Social Needs  . Financial resource strain: None  . Food insecurity - worry: None  . Food insecurity - inability: None  . Transportation needs - medical: None  . Transportation needs - non-medical: None  Occupational History  . None  Tobacco Use  . Smoking status: Former Smoker    Last attempt to quit: 09/25/1984    Years since quitting: 33.0  . Smokeless tobacco: Never Used  Substance and Sexual Activity  . Alcohol use: Yes  . Drug use: No  . Sexual activity: No  Other Topics Concern  . None  Social History Narrative  . None     Family History:  The patient's family history includes Fainting in his sister; Heart attack in his father; Stroke in his maternal grandfather, maternal grandmother, and mother.   ROS:   Please see the history of present illness.    ROS All other systems reviewed and are negative.   PHYSICAL EXAM:   VS:  BP (!) 154/78   Pulse 85   Ht 5' 11.5" (1.816 m)   Wt 196 lb 6.4 oz (89.1 kg)   BMI 27.01 kg/m     General: Alert, oriented x3, no distress, kyphosis [name]: no evidence of trauma, PERRL, EOMI, no exophtalmos or lid lag, no  myxedema, no xanthelasma; normal ears, nose and oropharynx Neck: normal jugular venous pulsations and no hepatojugular reflux; brisk carotid pulses without delay and no carotid bruits Chest: clear to auscultation, no signs of consolidation by percussion or palpation, normal fremitus, symmetrical and full respiratory excursions.  Healthy left subclavian pacemaker site Cardiovascular: normal position and quality of the apical impulse, regular rhythm, normal first and second heart sounds, no murmurs, rubs or gallops Abdomen: no tenderness or distention, no masses by palpation, no abnormal pulsatility or arterial bruits, normal bowel sounds, no hepatosplenomegaly Extremities: no clubbing, cyanosis or edema; 2+ radial, ulnar and brachial pulses bilaterally; 2+ right femoral, posterior tibial and dorsalis pedis pulses; 2+ left femoral, posterior tibial and dorsalis pedis  pulses; no subclavian or femoral bruits Neurological: grossly nonfocal Psych: Normal mood and affect   Wt Readings from Last 3 Encounters:  09/28/17 196 lb 6.4 oz (89.1 kg)  07/08/17 198 lb 9.6 oz (90.1 kg)  07/07/17 195 lb (88.5 kg)      Studies/Labs Reviewed:   EKG:  EKG is not ordered today.    Recent Labs: 07/08/2017: ALT 7; BUN 29.0; Creatinine 1.6; HGB 11.1; Platelets 113; Potassium 4.3; Sodium 140   Additional studies/ records that were reviewed today include:  Notes from oncology clinic  ASSESSMENT:    1. Tachycardia-bradycardia syndrome ([place])   2. Pacemaker   3. Paroxysmal atrial tachycardia ([place])   4. Nonsustained ventricular tachycardia ([place])   5. History of inferior vena caval filter placement   6. Pure hypercholesterolemia   7. Splenic marginal zone b-cell lymphoma ([place])   8. Right hip pain      PLAN:  In order of problems listed above:  1. SSS: He is not pacemaker dependent.  Heart rate histogram distribution shows appropriate sensor settings.  He does not have symptoms of chronotropic  incompetence 2. PM: Normal pacemaker function.  He has an MRI conditional pacemaker system and can have MRI scans with appropriate precautions.  Plan remote downloads every 3 months and office visit yearly. 3. PAT: None since pacemaker changeout. 4. NSVT: None since pacemaker changeout. 5. History of pulmonary embolism and chronic inferior vena cava filter: Continue long-term warfarin anticoagulation. 6. HLP: On statin. 7. Lymphoproliferative disorder, splenic marginal zone lymphoma.  His last appointment with Dr. Julien Nordmann was in September.  He has leukocytosis, mild anemia and thrombocytopenia and the plan remains for observation only. 8. R hip pain: With his history of osteoporosis and vertebral fractures, previous left hip fracture and persistent pain I think this should be evaluated with radiological studies.  He tells me that his primary care provider has already referred him to an orthopedic specialist.  MRIs can be performed with appropriate reprogramming of his pacemaker.    Medication Adjustments/Labs and Tests Ordered: Current medicines are reviewed at length with the patient today.  Concerns regarding medicines are outlined above.  Medication changes, Labs and Tests ordered today are listed in the Patient Instructions below. Patient Instructions  Dr Sallyanne Kuster recommends that you continue on your current medications as directed. Please refer to the Current Medication list given to you today.  Remote monitoring is used to monitor your Pacemaker or ICD from home. This monitoring reduces the number of office visits required to check your device to one time per year. It allows [place] to keep an eye on the functioning of your device to ensure it is working properly. You are scheduled for a device check from home on Tuesday, March 5th, 2019. You may send your transmission at any time that day. If you have a wireless device, the transmission will be sent automatically. After your physician reviews your  transmission, you will receive a notification with your next transmission date.  Dr Sallyanne Kuster recommends that you schedule a follow-up appointment in 12 months with a pacemaker check. You will receive a reminder letter in the mail two months in advance. If you don't receive a letter, please call our office to schedule the follow-up appointment.  If you need a refill on your cardiac medications before your next appointment, please call your pharmacy.    Signed, Sanda Klein, MD  09/28/2017 10:20 AM    [facility] [place], [place]  [address] Phone: [phone]; Fax: [phone]

## 2017-10-12 LAB — CUP PACEART INCLINIC DEVICE CHECK
Battery Remaining Longevity: 162 mo
Battery Voltage: 3.2 V
Brady Statistic RA Percent Paced: 91.66 %
Brady Statistic RV Percent Paced: 0.08 %
Date Time Interrogation Session: 20181204144821
Implantable Lead Implant Date: 20080930
Implantable Lead Implant Date: 20080930
Implantable Lead Location: 753859
Implantable Lead Location: 753860
Implantable Pulse Generator Implant Date: 20180912
Lead Channel Impedance Value: 418 Ohm
Lead Channel Impedance Value: 437 Ohm
Lead Channel Pacing Threshold Amplitude: 0.625 V
Lead Channel Pacing Threshold Amplitude: 1.25 V
Lead Channel Pacing Threshold Pulse Width: 0.4 ms
Lead Channel Sensing Intrinsic Amplitude: 1.75 mV
Lead Channel Sensing Intrinsic Amplitude: 1.75 mV
Lead Channel Setting Pacing Amplitude: 2.5 V
Lead Channel Setting Sensing Sensitivity: 2.8 mV
MDC IDC MSMT LEADCHNL RA IMPEDANCE VALUE: 285 Ohm
MDC IDC MSMT LEADCHNL RV IMPEDANCE VALUE: 323 Ohm
MDC IDC MSMT LEADCHNL RV PACING THRESHOLD PULSEWIDTH: 0.4 ms
MDC IDC MSMT LEADCHNL RV SENSING INTR AMPL: 17.5 mV
MDC IDC MSMT LEADCHNL RV SENSING INTR AMPL: 17.5 mV
MDC IDC SET LEADCHNL RA PACING AMPLITUDE: 1.5 V
MDC IDC SET LEADCHNL RV PACING PULSEWIDTH: 0.4 ms
MDC IDC STAT BRADY AP VP PERCENT: 0.08 %
MDC IDC STAT BRADY AP VS PERCENT: 91.34 %
MDC IDC STAT BRADY AS VP PERCENT: 0.01 %
MDC IDC STAT BRADY AS VS PERCENT: 8.57 %

## 2017-12-28 ENCOUNTER — Ambulatory Visit (INDEPENDENT_AMBULATORY_CARE_PROVIDER_SITE_OTHER): Payer: Medicare Other | Admitting: *Deleted

## 2017-12-28 DIAGNOSIS — I495 Sick sinus syndrome: Secondary | ICD-10-CM | POA: Diagnosis not present

## 2017-12-28 LAB — CUP PACEART REMOTE DEVICE CHECK
Battery Voltage: 3.17 V
Brady Statistic AP VP Percent: 0.05 %
Brady Statistic AP VS Percent: 72.77 %
Brady Statistic AS VS Percent: 27.15 %
Brady Statistic RV Percent Paced: 0.08 %
Implantable Lead Implant Date: 20080930
Implantable Lead Location: 753859
Implantable Lead Model: 5076
Lead Channel Impedance Value: 304 Ohm
Lead Channel Impedance Value: 323 Ohm
Lead Channel Impedance Value: 418 Ohm
Lead Channel Impedance Value: 532 Ohm
Lead Channel Pacing Threshold Amplitude: 0.625 V
Lead Channel Pacing Threshold Amplitude: 1.125 V
Lead Channel Pacing Threshold Pulse Width: 0.4 ms
Lead Channel Pacing Threshold Pulse Width: 0.4 ms
Lead Channel Sensing Intrinsic Amplitude: 19.125 mV
Lead Channel Sensing Intrinsic Amplitude: 2.125 mV
Lead Channel Setting Pacing Amplitude: 2.75 V
MDC IDC LEAD IMPLANT DT: 20080930
MDC IDC LEAD LOCATION: 753860
MDC IDC MSMT BATTERY REMAINING LONGEVITY: 164 mo
MDC IDC MSMT LEADCHNL RA SENSING INTR AMPL: 2.125 mV
MDC IDC MSMT LEADCHNL RV SENSING INTR AMPL: 19.125 mV
MDC IDC PG IMPLANT DT: 20180912
MDC IDC SESS DTM: 20190305053517
MDC IDC SET LEADCHNL RA PACING AMPLITUDE: 1.5 V
MDC IDC SET LEADCHNL RV PACING PULSEWIDTH: 0.4 ms
MDC IDC SET LEADCHNL RV SENSING SENSITIVITY: 2.8 mV
MDC IDC STAT BRADY AS VP PERCENT: 0.03 %
MDC IDC STAT BRADY RA PERCENT PACED: 73.85 %

## 2017-12-28 NOTE — Progress Notes (Signed)
Remote pacemaker transmission.   

## 2017-12-29 ENCOUNTER — Encounter: Payer: Self-pay | Admitting: Cardiology

## 2018-01-05 ENCOUNTER — Inpatient Hospital Stay: Payer: Medicare Other

## 2018-01-05 ENCOUNTER — Telehealth: Payer: Self-pay

## 2018-01-05 ENCOUNTER — Encounter: Payer: Self-pay | Admitting: Internal Medicine

## 2018-01-05 ENCOUNTER — Inpatient Hospital Stay: Payer: Medicare Other | Attending: Internal Medicine | Admitting: Internal Medicine

## 2018-01-05 VITALS — BP 120/37 | HR 77 | Temp 97.6°F | Resp 19 | Ht 71.5 in | Wt 188.2 lb

## 2018-01-05 DIAGNOSIS — C8307 Small cell B-cell lymphoma, spleen: Secondary | ICD-10-CM | POA: Diagnosis not present

## 2018-01-05 DIAGNOSIS — D72829 Elevated white blood cell count, unspecified: Secondary | ICD-10-CM | POA: Insufficient documentation

## 2018-01-05 LAB — CBC WITH DIFFERENTIAL/PLATELET
Basophils Absolute: 0.1 10*3/uL (ref 0.0–0.1)
Basophils Relative: 0 %
Eosinophils Absolute: 0.5 10*3/uL (ref 0.0–0.5)
Eosinophils Relative: 2 %
HCT: 35.5 % — ABNORMAL LOW (ref 38.4–49.9)
HEMOGLOBIN: 11.5 g/dL — AB (ref 13.0–17.1)
LYMPHS PCT: 80 %
Lymphs Abs: 22.3 10*3/uL — ABNORMAL HIGH (ref 0.9–3.3)
MCH: 33 pg (ref 27.2–33.4)
MCHC: 32.3 g/dL (ref 32.0–36.0)
MCV: 102.4 fL — AB (ref 79.3–98.0)
MONO ABS: 0.9 10*3/uL (ref 0.1–0.9)
Monocytes Relative: 3 %
NEUTROS ABS: 4.3 10*3/uL (ref 1.5–6.5)
NEUTROS PCT: 15 %
Platelets: 153 10*3/uL (ref 140–400)
RBC: 3.47 MIL/uL — AB (ref 4.20–5.82)
RDW: 15.7 % — ABNORMAL HIGH (ref 11.0–14.6)
WBC: 28.1 10*3/uL — AB (ref 4.0–10.3)

## 2018-01-05 LAB — COMPREHENSIVE METABOLIC PANEL
ALT: 9 U/L (ref 0–55)
ANION GAP: 7 (ref 3–11)
AST: 18 U/L (ref 5–34)
Albumin: 3.7 g/dL (ref 3.5–5.0)
Alkaline Phosphatase: 66 U/L (ref 40–150)
BUN: 23 mg/dL (ref 7–26)
CO2: 25 mmol/L (ref 22–29)
Calcium: 9.4 mg/dL (ref 8.4–10.4)
Chloride: 110 mmol/L — ABNORMAL HIGH (ref 98–109)
Creatinine, Ser: 1.34 mg/dL — ABNORMAL HIGH (ref 0.70–1.30)
GFR calc Af Amer: 52 mL/min — ABNORMAL LOW (ref >60)
GFR, EST NON AFRICAN AMERICAN: 45 mL/min — AB (ref >60)
Glucose, Bld: 83 mg/dL (ref 70–140)
POTASSIUM: 4.3 mmol/L (ref 3.5–5.1)
Sodium: 142 mmol/L (ref 136–145)
Total Bilirubin: 0.4 mg/dL (ref 0.2–1.2)
Total Protein: 6.6 g/dL (ref 6.4–8.3)

## 2018-01-05 LAB — LACTATE DEHYDROGENASE: LDH: 191 U/L (ref 125–245)

## 2018-01-05 NOTE — Progress Notes (Signed)
[facility] Telephone:([phone]) [phone]   Fax:(336) [place], [place] [address]  DIAGNOSIS: lymphoproliferative disorder suspicious for splenic marginal zone lymphoma.  PRIOR THERAPY: None  CURRENT THERAPY: Observation.  INTERVAL HISTORY: David [name] 84 y.o. male returns to the clinic today for 6 months follow-up visit.  The patient is feeling fine today with no specific complaints except for right hip pain secondary to compression fracture of L5.  He was seen by orthopedic surgery and received a steroid injection to that area.  He is also undergoing physical therapy.  He denied having any chest pain, shortness of breath, cough or hemoptysis.  He denied having any recent weight loss or night sweats.  He has no nausea, vomiting, diarrhea or constipation.  He is here today for evaluation and repeat blood work.   MEDICAL HISTORY: Past Medical History:  Diagnosis Date  . Dyslipidemia   . Kyphosis   . Nonsustained ventricular tachycardia ([place])   . Osteoporosis    Severe  . Paroxysmal atrial fibrillation (HCC)   . Pulmonary embolism ([place])    following a hip fracture and surgery. 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 Sinus node dysfunction (HCC)   . 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 Tachycardia-bradycardia syndrome ([place]) 2008   a. s/p MDT dual chamber PPM     ALLERGIES:  is allergic to bee venom.  MEDICATIONS:  Current Outpatient Medications  Medication Sig Dispense Refill  . acetaminophen (TYLENOL) 325 MG tablet Take 325-650 mg by mouth daily as needed for moderate pain.    Marland Kitchen alfuzosin (UROXATRAL) 10 MG 24 hr tablet Take 10 mg by mouth every other day.  0  . allopurinol (ZYLOPRIM) 100 MG tablet Take 100 mg by mouth daily.   0  . Calcium-Vitamin D-Vitamin K (CALCIUM + D + K PO) Take 500 mg by mouth daily.    . Cholecalciferol  (VITAMIN D) 2000 UNITS tablet Take 2,000 Units by mouth daily.    . Coenzyme Q10 (CO Q-10) 100 MG CAPS Take 100 mg by mouth daily.     Marland Kitchen EPINEPHrine 0.3 mg/0.3 mL IJ SOAJ injection Inject 0.3 mg into the muscle as needed (allergic reaction).     . fexofenadine (ALLEGRA) 180 MG tablet Take 180 mg by mouth daily as needed for allergies.     Marland Kitchen levothyroxine (SYNTHROID, LEVOTHROID) 25 MCG tablet Take 25 mcg by mouth daily.  1  . Multiple Vitamins-Minerals (PRESERVISION AREDS) CAPS Take 1 capsule by mouth 2 (two) times daily.     Marland Kitchen neomycin-bacitracin-polymyxin (NEOSPORIN) ointment Apply 1 application topically daily as needed for wound care.    . simvastatin (ZOCOR) 40 MG tablet Take 40 mg by mouth daily.  1  . sodium chloride (OCEAN) 0.65 % SOLN nasal spray Place 1 spray into both nostrils as needed for congestion.    . vitamin B-12 (CYANOCOBALAMIN) 1000 MCG tablet Take 1,000 mcg by mouth daily.    Marland Kitchen warfarin (COUMADIN) 5 MG tablet Take 5-7.5 mg by mouth daily. Take 5 mg daily except on Wed take 7.5 mg daily  0   No current facility-administered medications for this visit.     SURGICAL HISTORY:  Past Surgical History:  Procedure Laterality Date  . PACEMAKER INSERTION     Medtronic Adapta model #ADDRO1, serial [id number] H  . PPM GENERATOR CHANGEOUT N/A 07/07/2017  Procedure: PPM GENERATOR CHANGEOUT;  Surgeon: Sanda Klein, MD;  Location: [facility];  Service: Cardiovascular;  Laterality: N/A;  . TOTAL HIP ARTHROPLASTY  2004    REVIEW OF SYSTEMS:  A comprehensive review of systems was negative except for: Constitutional: positive for fatigue Musculoskeletal: positive for arthralgias   PHYSICAL EXAMINATION: General appearance: alert, cooperative, fatigued and no distress Head: Normocephalic, without obvious abnormality, atraumatic Neck: no adenopathy, no JVD, supple, symmetrical, trachea midline and thyroid not enlarged, symmetric, no tenderness/mass/nodules Lymph nodes: Cervical,  supraclavicular, and axillary nodes normal. Resp: clear to auscultation bilaterally Back: symmetric, no curvature. ROM normal. No CVA tenderness. Cardio: regular rate and rhythm, S1, S2 normal, no murmur, click, rub or gallop GI: soft, non-tender; bowel sounds normal; no masses,  no organomegaly Extremities: extremities normal, atraumatic, no cyanosis or edema  ECOG PERFORMANCE STATUS: 1 - Symptomatic but completely ambulatory  Blood pressure (!) 120/37, pulse 77, temperature 97.6 F (36.4 C), temperature source Oral, resp. rate 19, height 5' 11.5" (1.816 m), weight 188 lb 3.2 oz (85.4 kg), SpO2 98 %.  LABORATORY DATA: Lab Results  Component Value Date   WBC 28.1 (H) 01/05/2018   HGB 11.5 (L) 01/05/2018   HCT 35.5 (L) 01/05/2018   MCV 102.4 (H) 01/05/2018   PLT 153 01/05/2018      Chemistry      Component Value Date/Time   NA 142 01/05/2018 0952   NA 140 07/08/2017 1022   K 4.3 01/05/2018 0952   K 4.3 07/08/2017 1022   CL 110 (H) 01/05/2018 0952   CO2 25 01/05/2018 0952   CO2 23 07/08/2017 1022   BUN 23 01/05/2018 0952   BUN 29.0 (H) 07/08/2017 1022   CREATININE 1.34 (H) 01/05/2018 0952   CREATININE 1.6 (H) 07/08/2017 1022      Component Value Date/Time   CALCIUM 9.4 01/05/2018 0952   CALCIUM 8.9 07/08/2017 1022   ALKPHOS 66 01/05/2018 0952   ALKPHOS 60 07/08/2017 1022   AST 18 01/05/2018 0952   AST 19 07/08/2017 1022   ALT 9 01/05/2018 0952   ALT 7 07/08/2017 1022   BILITOT 0.4 01/05/2018 0952   BILITOT 0.44 07/08/2017 1022       RADIOGRAPHIC STUDIES: No results found.  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 The patient is doing fine today with no specific complaints except for the right hip pain. CBC today showed further increase in his leukocyte count.  His hemoglobin and hematocrit are stable and platelets count had improved. I  discussed the lab results with the patient and recommended for him to continue on observation with repeat CBC, comprehensive metabolic panel and LDH in 3 months for further evaluation of his condition. He was advised to call immediately if he has any concerning symptoms in the interval. The patient voices understanding of current disease status and treatment options and is in agreement with the current care plan. All questions were answered. The patient knows to call the clinic with any problems, questions or concerns. We can certainly see the patient much sooner if necessary. I spent 10 minutes counseling the patient face to face. The total time spent in the appointment was 15 minutes.  Disclaimer: This note was dictated with voice recognition software. Similar sounding words can inadvertently be transcribed and may not be corrected upon review.

## 2018-01-05 NOTE — Telephone Encounter (Signed)
Printed avs and calender of upcoming appointment. Per 3/13 los.  

## 2018-03-17 ENCOUNTER — Other Ambulatory Visit (HOSPITAL_COMMUNITY): Payer: Self-pay

## 2018-03-18 ENCOUNTER — Ambulatory Visit (HOSPITAL_COMMUNITY)
Admission: RE | Admit: 2018-03-18 | Discharge: 2018-03-18 | Disposition: A | Discharge status: Home / Self Care | Payer: Medicare Other | Source: Ambulatory Visit | Attending: Internal Medicine | Admitting: Internal Medicine

## 2018-03-18 DIAGNOSIS — M810 Age-related osteoporosis without current pathological fracture: Secondary | ICD-10-CM | POA: Insufficient documentation

## 2018-03-18 MED ORDER — DENOSUMAB 60 MG/ML ~~LOC~~ SOSY
60.0000 mg | PREFILLED_SYRINGE | Freq: Once | SUBCUTANEOUS | Status: AC
  Administered 2018-03-18: 15:00:00 60 mg via SUBCUTANEOUS
  Filled 2018-03-18: qty 1

## 2018-03-18 NOTE — Discharge Instructions (Signed)
Denosumab injection °What is this medicine? °DENOSUMAB (den oh sue mab) slows bone breakdown. Prolia is used to treat osteoporosis in women after menopause and in men. Xgeva is used to treat a high calcium level due to cancer and to prevent bone fractures and other bone problems caused by multiple myeloma or cancer bone metastases. Xgeva is also used to treat giant cell tumor of the bone. °This medicine may be used for other purposes; ask your health care provider or pharmacist if you have questions. °COMMON BRAND NAME(S): Prolia, XGEVA °What should I tell my health care provider before I take this medicine? °They need to know if you have any of these conditions: °-dental disease °-having surgery or tooth extraction °-infection °-kidney disease °-low levels of calcium or Vitamin D in the blood °-malnutrition °-on hemodialysis °-skin conditions or sensitivity °-thyroid or parathyroid disease °-an unusual reaction to denosumab, other medicines, foods, dyes, or preservatives °-pregnant or trying to get pregnant °-breast-feeding °How should I use this medicine? °This medicine is for injection under the skin. It is given by a health care professional in a hospital or clinic setting. °If you are getting Prolia, a special MedGuide will be given to you by the pharmacist with each prescription and refill. Be sure to read this information carefully each time. °For Prolia, talk to your pediatrician regarding the use of this medicine in children. Special care may be needed. For Xgeva, talk to your pediatrician regarding the use of this medicine in children. While this drug may be prescribed for children as young as 13 years for selected conditions, precautions do apply. °Overdosage: If you think you have taken too much of this medicine contact a poison control center or emergency room at once. °NOTE: This medicine is only for you. Do not share this medicine with others. °What if I miss a dose? °It is important not to miss your  dose. Call your doctor or health care professional if you are unable to keep an appointment. °What may interact with this medicine? °Do not take this medicine with any of the following medications: °-other medicines containing denosumab °This medicine may also interact with the following medications: °-medicines that lower your chance of fighting infection °-steroid medicines like prednisone or cortisone °This list may not describe all possible interactions. Give your health care provider a list of all the medicines, herbs, non-prescription drugs, or dietary supplements you use. Also tell them if you smoke, drink alcohol, or use illegal drugs. Some items may interact with your medicine. °What should I watch for while using this medicine? °Visit your doctor or health care professional for regular checks on your progress. Your doctor or health care professional may order blood tests and other tests to see how you are doing. °Call your doctor or health care professional for advice if you get a fever, chills or sore throat, or other symptoms of a cold or flu. Do not treat yourself. This drug may decrease your body's ability to fight infection. Try to avoid being around people who are sick. °You should make sure you get enough calcium and vitamin D while you are taking this medicine, unless your doctor tells you not to. Discuss the foods you eat and the vitamins you take with your health care professional. °See your dentist regularly. Brush and floss your teeth as directed. Before you have any dental work done, tell your dentist you are receiving this medicine. °Do not become pregnant while taking this medicine or for 5 months after stopping   it. Talk with your doctor or health care professional about your birth control options while taking this medicine. Women should inform their doctor if they wish to become pregnant or think they might be pregnant. There is a potential for serious side effects to an unborn child. Talk  to your health care professional or pharmacist for more information. What side effects may I notice from receiving this medicine? Side effects that you should report to your doctor or health care professional as soon as possible: -allergic reactions like skin rash, itching or hives, swelling of the face, lips, or tongue -bone pain -breathing problems -dizziness -jaw pain, especially after dental work -redness, blistering, peeling of the skin -signs and symptoms of infection like fever or chills; cough; sore throat; pain or trouble passing urine -signs of low calcium like fast heartbeat, muscle cramps or muscle pain; pain, tingling, numbness in the hands or feet; seizures -unusual bleeding or bruising -unusually weak or tired Side effects that usually do not require medical attention (report to your doctor or health care professional if they continue or are bothersome): -constipation -diarrhea -headache -joint pain -loss of appetite -muscle pain -runny nose -tiredness -upset stomach This list may not describe all possible side effects. Call your doctor for medical advice about side effects. You may report side effects to FDA at 1-800-FDA-1088. Where should I keep my medicine? This medicine is only given in a clinic, doctor's office, or other health care setting and will not be stored at home. NOTE: This sheet is a summary. It may not cover all possible information. If you have questions about this medicine, talk to your doctor, pharmacist, or health care provider.  2018 Elsevier/Gold Standard (2016-11-03 19:17:21)

## 2018-03-29 ENCOUNTER — Ambulatory Visit (INDEPENDENT_AMBULATORY_CARE_PROVIDER_SITE_OTHER): Payer: Medicare Other | Admitting: *Deleted

## 2018-03-29 DIAGNOSIS — I495 Sick sinus syndrome: Secondary | ICD-10-CM | POA: Diagnosis not present

## 2018-03-29 NOTE — Progress Notes (Signed)
Remote pacemaker transmission.   

## 2018-04-06 ENCOUNTER — Inpatient Hospital Stay: Payer: Medicare Other

## 2018-04-06 ENCOUNTER — Inpatient Hospital Stay: Payer: Medicare Other | Attending: Internal Medicine | Admitting: Internal Medicine

## 2018-04-06 ENCOUNTER — Encounter: Payer: Self-pay | Admitting: Internal Medicine

## 2018-04-06 ENCOUNTER — Telehealth: Payer: Self-pay | Admitting: Internal Medicine

## 2018-04-06 VITALS — BP 122/61 | HR 83 | Temp 97.7°F | Resp 18 | Ht 71.5 in | Wt 199.3 lb

## 2018-04-06 DIAGNOSIS — Z86711 Personal history of pulmonary embolism: Secondary | ICD-10-CM | POA: Diagnosis not present

## 2018-04-06 DIAGNOSIS — D649 Anemia, unspecified: Secondary | ICD-10-CM | POA: Insufficient documentation

## 2018-04-06 DIAGNOSIS — D696 Thrombocytopenia, unspecified: Secondary | ICD-10-CM

## 2018-04-06 DIAGNOSIS — C884 Extranodal marginal zone B-cell lymphoma of mucosa-associated lymphoid tissue [MALT-lymphoma]: Secondary | ICD-10-CM | POA: Insufficient documentation

## 2018-04-06 DIAGNOSIS — C8307 Small cell B-cell lymphoma, spleen: Secondary | ICD-10-CM

## 2018-04-06 DIAGNOSIS — Z7901 Long term (current) use of anticoagulants: Secondary | ICD-10-CM | POA: Insufficient documentation

## 2018-04-06 LAB — CMP (CANCER CENTER ONLY)
ALBUMIN: 3.7 g/dL (ref 3.5–5.0)
ALT: 7 U/L (ref 0–55)
AST: 18 U/L (ref 5–34)
Alkaline Phosphatase: 71 U/L (ref 40–150)
Anion gap: 6 (ref 3–11)
BILIRUBIN TOTAL: 0.3 mg/dL (ref 0.2–1.2)
BUN: 23 mg/dL (ref 7–26)
CO2: 25 mmol/L (ref 22–29)
CREATININE: 1.3 mg/dL (ref 0.70–1.30)
Calcium: 8.9 mg/dL (ref 8.4–10.4)
Chloride: 111 mmol/L — ABNORMAL HIGH (ref 98–109)
GFR, Est AFR Am: 54 mL/min — ABNORMAL LOW (ref >60)
GFR, Estimated: 47 mL/min — ABNORMAL LOW (ref >60)
GLUCOSE: 97 mg/dL (ref 70–140)
Potassium: 4.6 mmol/L (ref 3.5–5.1)
Sodium: 142 mmol/L (ref 136–145)
TOTAL PROTEIN: 6.4 g/dL (ref 6.4–8.3)

## 2018-04-06 LAB — CBC WITH DIFFERENTIAL (CANCER CENTER ONLY)
BASOS PCT: 0 %
Basophils Absolute: 0.1 10*3/uL (ref 0.0–0.1)
Eosinophils Absolute: 0.4 10*3/uL (ref 0.0–0.5)
Eosinophils Relative: 2 %
HEMATOCRIT: 34 % — AB (ref 38.4–49.9)
Hemoglobin: 10.7 g/dL — ABNORMAL LOW (ref 13.0–17.1)
LYMPHS PCT: 84 %
Lymphs Abs: 21.9 10*3/uL — ABNORMAL HIGH (ref 0.9–3.3)
MCH: 32.9 pg (ref 27.2–33.4)
MCHC: 31.5 g/dL — AB (ref 32.0–36.0)
MCV: 104.6 fL — AB (ref 79.3–98.0)
MONO ABS: 0.7 10*3/uL (ref 0.1–0.9)
MONOS PCT: 3 %
Neutro Abs: 2.9 10*3/uL (ref 1.5–6.5)
Neutrophils Relative %: 11 %
Platelet Count: 120 10*3/uL — ABNORMAL LOW (ref 140–400)
RBC: 3.25 MIL/uL — ABNORMAL LOW (ref 4.20–5.82)
RDW: 14.3 % (ref 11.0–14.6)
WBC Count: 26 10*3/uL — ABNORMAL HIGH (ref 4.0–10.3)

## 2018-04-06 LAB — LACTATE DEHYDROGENASE: LDH: 199 U/L (ref 125–245)

## 2018-04-06 NOTE — Telephone Encounter (Signed)
Scheduled appt per 6/12 los - gave patient AVS and calender per los.  

## 2018-04-06 NOTE — Progress Notes (Signed)
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  PRIOR THERAPY: None  CURRENT THERAPY: Observation.  INTERVAL HISTORY: David [name] 84 y.o. male returns to the clinic today for 3 months follow-up visit accompanied by his wife.  The patient has no complaints today except for the generalized fatigue.  He denied having any recent chest pain, shortness breath, cough or hemoptysis.  He denied having any recent weight loss or night sweats.  He has no bleeding issues or palpable lymphadenopathy.  He is here today for evaluation and repeat blood work.  MEDICAL HISTORY: Past Medical History:  Diagnosis Date  . Dyslipidemia   . Kyphosis   . Nonsustained ventricular tachycardia ([facility])   . Osteoporosis    Severe  . Paroxysmal atrial fibrillation ([facility])   . Pulmonary embolism ([facility])    following a hip fracture and surgery. 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 Sinus node dysfunction ([facility])   . 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 Tachycardia-bradycardia syndrome ([facility]) 2008   a. s/p MDT dual chamber PPM     ALLERGIES:  is allergic to bee venom.  MEDICATIONS:  Current Outpatient Medications  Medication Sig Dispense Refill  . acetaminophen (TYLENOL) 325 MG tablet Take 325-650 mg by mouth daily as needed for moderate pain.    Marland Kitchen alfuzosin (UROXATRAL) 10 MG 24 hr tablet Take 10 mg by mouth every other day.  0  . allopurinol (ZYLOPRIM) 100 MG tablet Take 100 mg by mouth daily.   0  . Calcium-Vitamin D-Vitamin K (CALCIUM + D + K PO) Take 500 mg by mouth daily.    . Cholecalciferol (VITAMIN D) 2000 UNITS tablet Take 2,000 Units by mouth daily.    . Coenzyme Q10 (CO Q-10) 100 MG CAPS Take 100 mg by mouth daily.     . fexofenadine  (ALLEGRA) 180 MG tablet Take 180 mg by mouth daily as needed for allergies.     Marland Kitchen levothyroxine (SYNTHROID, LEVOTHROID) 25 MCG tablet Take 25 mcg by mouth daily.  1  . Multiple Vitamins-Minerals (PRESERVISION AREDS) CAPS Take 1 capsule by mouth 2 (two) times daily.     . simvastatin (ZOCOR) 40 MG tablet Take 40 mg by mouth daily.  1  . sodium chloride (OCEAN) 0.65 % SOLN nasal spray Place 1 spray into both nostrils as needed for congestion.    . vitamin B-12 (CYANOCOBALAMIN) 1000 MCG tablet Take 1,000 mcg by mouth daily.    Marland Kitchen warfarin (COUMADIN) 5 MG tablet Take 5-7.5 mg by mouth daily. Take 5 mg daily except on Wed take 7.5 mg daily  0  . EPINEPHrine 0.3 mg/0.3 mL IJ SOAJ injection Inject 0.3 mg into the muscle as needed (allergic reaction).     . neomycin-bacitracin-polymyxin (NEOSPORIN) ointment Apply 1 application topically daily as needed for wound care.     No current facility-administered medications for this visit.     SURGICAL HISTORY:  Past Surgical History:  Procedure Laterality Date  . PACEMAKER INSERTION     Medtronic Adapta model #ADDRO1, serial [id number]  . PPM GENERATOR CHANGEOUT N/A 07/07/2017   Procedure: PPM GENERATOR CHANGEOUT;  Surgeon: Sanda Klein, MD;  Location: [facility];  Service: Cardiovascular;  Laterality: N/A;  . TOTAL HIP ARTHROPLASTY  2004  REVIEW OF SYSTEMS:  A comprehensive review of systems was negative except for: Constitutional: positive for fatigue Musculoskeletal: positive for arthralgias   PHYSICAL EXAMINATION: General appearance: alert, cooperative, fatigued and no distress Head: Normocephalic, without obvious abnormality, atraumatic Neck: no adenopathy, no JVD, supple, symmetrical, trachea midline and thyroid not enlarged, symmetric, no tenderness/mass/nodules Lymph nodes: Cervical, supraclavicular, and axillary nodes normal. Resp: clear to auscultation bilaterally Back: symmetric, no curvature. ROM normal. No CVA  tenderness. Cardio: regular rate and rhythm, S1, S2 normal, no murmur, click, rub or gallop GI: soft, non-tender; bowel sounds normal; no masses,  no organomegaly Extremities: extremities normal, atraumatic, no cyanosis or edema  ECOG PERFORMANCE STATUS: 1 - Symptomatic but completely ambulatory  Blood pressure 122/61, pulse 83, temperature 97.7 F (36.5 C), temperature source Oral, resp. rate 18, height 5' 11.5" (1.816 m), weight 199 lb 4.8 oz (90.4 kg), SpO2 97 %.  LABORATORY DATA: Lab Results  Component Value Date   WBC 26.0 (H) 04/06/2018   HGB 10.7 (L) 04/06/2018   HCT 34.0 (L) 04/06/2018   MCV 104.6 (H) 04/06/2018   PLT 120 (L) 04/06/2018      Chemistry      Component Value Date/Time   NA 142 04/06/2018 1025   NA 140 07/08/2017 1022   K 4.6 04/06/2018 1025   K 4.3 07/08/2017 1022   CL 111 (H) 04/06/2018 1025   CO2 25 04/06/2018 1025   CO2 23 07/08/2017 1022   BUN 23 04/06/2018 1025   BUN 29.0 (H) 07/08/2017 1022   CREATININE 1.30 04/06/2018 1025   CREATININE 1.6 (H) 07/08/2017 1022      Component Value Date/Time   CALCIUM 8.9 04/06/2018 1025   CALCIUM 8.9 07/08/2017 1022   ALKPHOS 71 04/06/2018 1025   ALKPHOS 60 07/08/2017 1022   AST 18 04/06/2018 1025   AST 19 07/08/2017 1022   ALT 7 04/06/2018 1025   ALT 7 07/08/2017 1022   BILITOT 0.3 04/06/2018 1025   BILITOT 0.44 07/08/2017 1022       RADIOGRAPHIC STUDIES: No results found.  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 The patient is doing fine today with no specific complaints except for the right hip pain. CBC today showed persistent mild anemia and thrombocytopenia as well as elevated white blood count.  This did not significantly change from the previous blood work.  I discussed the lab results with the patient and recommended for him continuous observation and monitoring.  I  will see him back for follow-up visit in 3 months for reevaluation with repeat blood work.  He was also advised to call immediately if he has any bleeding issues or worsening fatigue in the next few months.  I would be happy to see him sooner if needed. The patient voices understanding of current disease status and treatment options and is in agreement with the current care plan. All questions were answered. The patient knows to call the clinic with any problems, questions or concerns. We can certainly see the patient much sooner if necessary. I spent 10 minutes counseling the patient face to face. The total time spent in the appointment was 15 minutes.  Disclaimer: This note was dictated with voice recognition software. Similar sounding words can inadvertently be transcribed and may not be corrected upon review.

## 2018-05-05 LAB — CUP PACEART REMOTE DEVICE CHECK
Battery Voltage: 3.14 V
Brady Statistic AP VP Percent: 0.06 %
Brady Statistic AS VS Percent: 18.97 %
Brady Statistic RA Percent Paced: 82.06 %
Date Time Interrogation Session: 20190604053316
Implantable Lead Implant Date: 20080930
Implantable Lead Location: 753860
Implantable Lead Model: 5076
Implantable Pulse Generator Implant Date: 20180912
Lead Channel Impedance Value: 285 Ohm
Lead Channel Impedance Value: 323 Ohm
Lead Channel Impedance Value: 399 Ohm
Lead Channel Pacing Threshold Amplitude: 1.25 V
Lead Channel Pacing Threshold Pulse Width: 0.4 ms
Lead Channel Sensing Intrinsic Amplitude: 18.25 mV
Lead Channel Sensing Intrinsic Amplitude: 2.125 mV
Lead Channel Setting Pacing Amplitude: 1.5 V
Lead Channel Setting Pacing Pulse Width: 0.4 ms
Lead Channel Setting Sensing Sensitivity: 2.8 mV
MDC IDC LEAD IMPLANT DT: 20080930
MDC IDC LEAD LOCATION: 753859
MDC IDC MSMT BATTERY REMAINING LONGEVITY: 160 mo
MDC IDC MSMT LEADCHNL RA IMPEDANCE VALUE: 494 Ohm
MDC IDC MSMT LEADCHNL RA PACING THRESHOLD AMPLITUDE: 0.625 V
MDC IDC MSMT LEADCHNL RA SENSING INTR AMPL: 2.125 mV
MDC IDC MSMT LEADCHNL RV PACING THRESHOLD PULSEWIDTH: 0.4 ms
MDC IDC MSMT LEADCHNL RV SENSING INTR AMPL: 18.25 mV
MDC IDC SET LEADCHNL RV PACING AMPLITUDE: 2.75 V
MDC IDC STAT BRADY AP VS PERCENT: 80.96 %
MDC IDC STAT BRADY AS VP PERCENT: 0.01 %
MDC IDC STAT BRADY RV PERCENT PACED: 0.07 %

## 2018-06-28 ENCOUNTER — Ambulatory Visit (INDEPENDENT_AMBULATORY_CARE_PROVIDER_SITE_OTHER): Payer: Medicare Other | Admitting: *Deleted

## 2018-06-28 DIAGNOSIS — I495 Sick sinus syndrome: Secondary | ICD-10-CM

## 2018-06-28 NOTE — Progress Notes (Signed)
Remote pacemaker transmission.   

## 2018-07-07 ENCOUNTER — Inpatient Hospital Stay: Payer: Medicare Other | Attending: Internal Medicine | Admitting: Internal Medicine

## 2018-07-07 ENCOUNTER — Encounter: Payer: Self-pay | Admitting: Internal Medicine

## 2018-07-07 ENCOUNTER — Telehealth: Payer: Self-pay | Admitting: Internal Medicine

## 2018-07-07 ENCOUNTER — Inpatient Hospital Stay: Payer: Medicare Other

## 2018-07-07 VITALS — BP 122/63 | HR 78 | Temp 97.9°F | Resp 18 | Wt 196.3 lb

## 2018-07-07 DIAGNOSIS — D72829 Elevated white blood cell count, unspecified: Secondary | ICD-10-CM | POA: Insufficient documentation

## 2018-07-07 DIAGNOSIS — C8307 Small cell B-cell lymphoma, spleen: Secondary | ICD-10-CM

## 2018-07-07 DIAGNOSIS — I2699 Other pulmonary embolism without acute cor pulmonale: Secondary | ICD-10-CM | POA: Insufficient documentation

## 2018-07-07 DIAGNOSIS — M818 Other osteoporosis without current pathological fracture: Secondary | ICD-10-CM | POA: Diagnosis not present

## 2018-07-07 DIAGNOSIS — C884 Extranodal marginal zone B-cell lymphoma of mucosa-associated lymphoid tissue [MALT-lymphoma]: Secondary | ICD-10-CM | POA: Insufficient documentation

## 2018-07-07 LAB — CBC WITH DIFFERENTIAL (CANCER CENTER ONLY)
Basophils Absolute: 0.1 10*3/uL (ref 0.0–0.1)
Basophils Relative: 0 %
Eosinophils Absolute: 0.5 10*3/uL (ref 0.0–0.5)
Eosinophils Relative: 2 %
HCT: 32.9 % — ABNORMAL LOW (ref 38.4–49.9)
Hemoglobin: 10.7 g/dL — ABNORMAL LOW (ref 13.0–17.1)
Lymphocytes Relative: 79 %
Lymphs Abs: 19.8 10*3/uL — ABNORMAL HIGH (ref 0.9–3.3)
MCH: 33.3 pg (ref 27.2–33.4)
MCHC: 32.3 g/dL (ref 32.0–36.0)
MCV: 102.9 fL — ABNORMAL HIGH (ref 79.3–98.0)
Monocytes Absolute: 0.9 10*3/uL (ref 0.1–0.9)
Monocytes Relative: 3 %
Neutro Abs: 3.9 10*3/uL (ref 1.5–6.5)
Neutrophils Relative %: 16 %
Platelet Count: 120 10*3/uL — ABNORMAL LOW (ref 140–400)
RBC: 3.2 MIL/uL — ABNORMAL LOW (ref 4.20–5.82)
RDW: 14.3 % (ref 11.0–14.6)
WBC Count: 25.1 10*3/uL — ABNORMAL HIGH (ref 4.0–10.3)

## 2018-07-07 LAB — CMP (CANCER CENTER ONLY)
ALK PHOS: 71 U/L (ref 38–126)
ALT: 7 U/L (ref 0–44)
ANION GAP: 6 (ref 5–15)
AST: 17 U/L (ref 15–41)
Albumin: 3.6 g/dL (ref 3.5–5.0)
BUN: 21 mg/dL (ref 8–23)
CALCIUM: 8.9 mg/dL (ref 8.9–10.3)
CO2: 25 mmol/L (ref 22–32)
Chloride: 111 mmol/L (ref 98–111)
Creatinine: 1.28 mg/dL — ABNORMAL HIGH (ref 0.61–1.24)
GFR, Est AFR Am: 55 mL/min — ABNORMAL LOW (ref >60)
GFR, Estimated: 48 mL/min — ABNORMAL LOW (ref >60)
Glucose, Bld: 84 mg/dL (ref 70–99)
Potassium: 4.6 mmol/L (ref 3.5–5.1)
SODIUM: 142 mmol/L (ref 135–145)
Total Bilirubin: 0.4 mg/dL (ref 0.3–1.2)
Total Protein: 6.3 g/dL — ABNORMAL LOW (ref 6.5–8.1)

## 2018-07-07 LAB — LACTATE DEHYDROGENASE: LDH: 211 U/L — ABNORMAL HIGH (ref 98–192)

## 2018-07-07 NOTE — Telephone Encounter (Signed)
Scheduled appt per 9/12 los - gave patient AVS and calender per los.   

## 2018-07-07 NOTE — Progress Notes (Signed)
    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  PRIOR THERAPY: None  CURRENT THERAPY: Observation.  INTERVAL HISTORY: David [name] 84 y.o. male returns to the clinic today for follow-up visit accompanied by his wife.  The patient has no recent complaints.  Is currently on treatment with Prolia every 6 months by his primary care physician.  He denied having any current fatigue or weakness.  He has no nausea, vomiting, diarrhea or constipation.  He denied having any weight loss or night sweats.  He has no chest pain, shortness breath, cough or hemoptysis.  The patient is here today for evaluation with repeat CBC, complaints metabolic panel and LDH.  MEDICAL HISTORY: Past Medical History:  Diagnosis Date  . Dyslipidemia   . Kyphosis   . Nonsustained ventricular tachycardia ([facility])   . Osteoporosis    Severe  . Paroxysmal atrial fibrillation (H[facility])   . Pulmonary embolism ([facility])    following a hip fracture and surgery. 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 Sinus node dysfunction (H[facility])   . 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 Tachycardia-bradycardia syndrome ([facility]) 2008   a. s/p MDT dual chamber PPM     ALLERGIES:  is allergic to bee venom.  MEDICATIONS:  Current Outpatient Medications  Medication Sig Dispense Refill  . acetaminophen (TYLENOL) 325 MG tablet Take 325-650 mg by mouth daily as needed for moderate pain.    . alfuzosin (UROXATRAL) 10 MG 24 hr tablet Take 10 mg by mouth every other day.  0  . allopurinol (ZYLOPRIM) 100 MG tablet Take 100 mg by mouth daily.   0  . Besifloxacin HCl (BESIVANCE) 0.6 % SUSP Besivance 0.6 % eye drops,suspension  instill 1 drop into left eye four times a day STARTING 2 DAYS  BEF...  (REFER TO PRESCRIPTION NOTES).    . Calcium-Vitamin D-Vitamin K (CALCIUM + D + K PO) Take 500 mg by mouth daily.    . Cholecalciferol (VITAMIN D) 2000 UNITS tablet Take 2,000 Units by mouth daily.    . Coenzyme Q10 (CO Q-10) 100 MG CAPS Take 100 mg by mouth daily.     . Difluprednate (DUREZOL) 0.05 % EMUL Durezol 0.05 % eye drops  instill 1 drop into left eye four times a day STARTING AFTER SURGERY    . EPINEPHrine 0.3 mg/0.3 mL IJ SOAJ injection Inject 0.3 mg into the muscle as needed (allergic reaction).     . fexofenadine (ALLEGRA) 180 MG tablet Take 180 mg by mouth daily as needed for allergies.     . levothyroxine (SYNTHROID, LEVOTHROID) 25 MCG tablet Take 25 mcg by mouth daily.  1  . Multiple Vitamins-Minerals (PRESERVISION AREDS) CAPS Take 1 capsule by mouth 2 (two) times daily.     . neomycin-bacitracin-polymyxin (NEOSPORIN) ointment Apply 1 application topically daily as needed for wound care.    . simvastatin (ZOCOR) 40 MG tablet Take 40 mg by mouth daily.  1  . sodium chloride (OCEAN) 0.65 % SOLN nasal spray Place 1 spray into both nostrils as needed for congestion.    . vitamin B-12 (CYANOCOBALAMIN) 1000 MCG tablet Take 1,000 mcg by mouth daily.    . warfarin (COUMADIN) 5 MG tablet Take 5-7.5 mg by mouth daily. Take 5 mg daily except on   Wed take 7.5 mg daily  0  . Zoster Vaccine Adjuvanted (SHINGRIX) injection Shingrix (PF) 50 mcg/0.5 mL intramuscular suspension, kit  inject 0.5 milliliter intramuscularly     No current facility-administered medications for this visit.     SURGICAL HISTORY:  Past Surgical History:  Procedure Laterality Date  . PACEMAKER INSERTION     Medtronic Adapta model #ADDRO1, serial #[id number]  . PPM GENERATOR CHANGEOUT N/A 07/07/2017   Procedure: PPM GENERATOR CHANGEOUT;  Surgeon: Croitoru, Mihai, MD;  Location: [facility];  Service: Cardiovascular;  Laterality: N/A;  . TOTAL HIP ARTHROPLASTY  2004    REVIEW OF SYSTEMS:  A  comprehensive review of systems was negative except for: Constitutional: positive for fatigue Musculoskeletal: positive for arthralgias   PHYSICAL EXAMINATION: General appearance: alert, cooperative, fatigued and no distress Head: Normocephalic, without obvious abnormality, atraumatic Neck: no adenopathy, no JVD, supple, symmetrical, trachea midline and thyroid not enlarged, symmetric, no tenderness/mass/nodules Lymph nodes: Cervical, supraclavicular, and axillary nodes normal. Resp: clear to auscultation bilaterally Back: symmetric, no curvature. ROM normal. No CVA tenderness. Cardio: regular rate and rhythm, S1, S2 normal, no murmur, click, rub or gallop GI: soft, non-tender; bowel sounds normal; no masses,  no organomegaly Extremities: extremities normal, atraumatic, no cyanosis or edema  ECOG PERFORMANCE STATUS: 1 - Symptomatic but completely ambulatory  Blood pressure 122/63, pulse 78, temperature 97.9 F (36.6 C), temperature source Oral, resp. rate 18, weight 196 lb 4.8 oz (89 kg), SpO2 97 %.  LABORATORY DATA: Lab Results  Component Value Date   WBC 25.1 (H) 07/07/2018   HGB 10.7 (L) 07/07/2018   HCT 32.9 (L) 07/07/2018   MCV 102.9 (H) 07/07/2018   PLT 120 (L) 07/07/2018      Chemistry      Component Value Date/Time   NA 142 07/07/2018 1014   NA 140 07/08/2017 1022   K 4.6 07/07/2018 1014   K 4.3 07/08/2017 1022   CL 111 07/07/2018 1014   CO2 25 07/07/2018 1014   CO2 23 07/08/2017 1022   BUN 21 07/07/2018 1014   BUN 29.0 (H) 07/08/2017 1022   CREATININE 1.28 (H) 07/07/2018 1014   CREATININE 1.6 (H) 07/08/2017 1022      Component Value Date/Time   CALCIUM 8.9 07/07/2018 1014   CALCIUM 8.9 07/08/2017 1022   ALKPHOS 71 07/07/2018 1014   ALKPHOS 60 07/08/2017 1022   AST 17 07/07/2018 1014   AST 19 07/08/2017 1022   ALT 7 07/07/2018 1014   ALT 7 07/08/2017 1022   BILITOT 0.4 07/07/2018 1014   BILITOT 0.44 07/08/2017 1022       RADIOGRAPHIC STUDIES: No  results found.  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 The patient continues to do well with no concerning complaints.  His CBC showed no significant progression in his total white blood count has been stable.  The patient also continues to have mild anemia and thrombocytopenia. I discussed the lab results with the patient and his wife and recommended for him to continue on observation with repeat CBC, complaints metabolic panel and LDH in 6 months. He was advised to call immediately if he has any concerning symptoms in the interval. The patient voices understanding of current disease status and treatment options and is in agreement with the current care plan. All questions were answered. The patient knows to call the clinic with any problems, questions or   concerns. We can certainly see the patient much sooner if necessary. I spent 10 minutes counseling the patient face to face. The total time spent in the appointment was 15 minutes.  Disclaimer: This note was dictated with voice recognition software. Similar sounding words can inadvertently be transcribed and may not be corrected upon review.       

## 2018-07-20 LAB — CUP PACEART REMOTE DEVICE CHECK
Brady Statistic AP VP Percent: 0.06 %
Brady Statistic AS VP Percent: 0.01 %
Brady Statistic RA Percent Paced: 86.16 %
Brady Statistic RV Percent Paced: 0.07 %
Date Time Interrogation Session: 20190903053533
Implantable Lead Model: 5076
Lead Channel Impedance Value: 342 Ohm
Lead Channel Impedance Value: 418 Ohm
Lead Channel Impedance Value: 494 Ohm
Lead Channel Pacing Threshold Amplitude: 0.625 V
Lead Channel Pacing Threshold Pulse Width: 0.4 ms
Lead Channel Sensing Intrinsic Amplitude: 17.5 mV
Lead Channel Setting Pacing Amplitude: 1.5 V
MDC IDC LEAD IMPLANT DT: 20080930
MDC IDC LEAD IMPLANT DT: 20080930
MDC IDC LEAD LOCATION: 753859
MDC IDC LEAD LOCATION: 753860
MDC IDC MSMT BATTERY REMAINING LONGEVITY: 157 mo
MDC IDC MSMT BATTERY VOLTAGE: 3.1 V
MDC IDC MSMT LEADCHNL RA IMPEDANCE VALUE: 304 Ohm
MDC IDC MSMT LEADCHNL RA SENSING INTR AMPL: 1.75 mV
MDC IDC MSMT LEADCHNL RA SENSING INTR AMPL: 1.75 mV
MDC IDC MSMT LEADCHNL RV PACING THRESHOLD AMPLITUDE: 1.25 V
MDC IDC MSMT LEADCHNL RV PACING THRESHOLD PULSEWIDTH: 0.4 ms
MDC IDC MSMT LEADCHNL RV SENSING INTR AMPL: 17.5 mV
MDC IDC PG IMPLANT DT: 20180912
MDC IDC SET LEADCHNL RV PACING AMPLITUDE: 2.5 V
MDC IDC SET LEADCHNL RV PACING PULSEWIDTH: 0.4 ms
MDC IDC SET LEADCHNL RV SENSING SENSITIVITY: 2.8 mV
MDC IDC STAT BRADY AP VS PERCENT: 85.39 %
MDC IDC STAT BRADY AS VS PERCENT: 14.54 %

## 2018-09-27 ENCOUNTER — Telehealth: Payer: Self-pay

## 2018-09-27 ENCOUNTER — Ambulatory Visit (INDEPENDENT_AMBULATORY_CARE_PROVIDER_SITE_OTHER): Payer: Medicare Other

## 2018-09-27 DIAGNOSIS — I495 Sick sinus syndrome: Secondary | ICD-10-CM | POA: Diagnosis not present

## 2018-09-27 NOTE — Telephone Encounter (Signed)
Confirmed remote transmission w/ pt son.    

## 2018-09-28 NOTE — Progress Notes (Signed)
Remote pacemaker transmission.   

## 2018-09-29 ENCOUNTER — Other Ambulatory Visit (HOSPITAL_COMMUNITY): Payer: Self-pay | Admitting: *Deleted

## 2018-09-30 ENCOUNTER — Ambulatory Visit (HOSPITAL_COMMUNITY)
Admission: RE | Admit: 2018-09-30 | Discharge: 2018-09-30 | Disposition: A | Discharge status: Home / Self Care | Payer: Medicare Other | Source: Ambulatory Visit | Attending: Internal Medicine | Admitting: Internal Medicine

## 2018-09-30 DIAGNOSIS — M810 Age-related osteoporosis without current pathological fracture: Secondary | ICD-10-CM | POA: Diagnosis not present

## 2018-09-30 MED ORDER — DENOSUMAB 60 MG/ML ~~LOC~~ SOSY
60.0000 mg | PREFILLED_SYRINGE | Freq: Once | SUBCUTANEOUS | Status: AC
  Administered 2018-09-30: 60 mg via SUBCUTANEOUS

## 2018-09-30 MED ORDER — DENOSUMAB 60 MG/ML ~~LOC~~ SOSY
PREFILLED_SYRINGE | SUBCUTANEOUS | Status: AC
  Filled 2018-09-30: qty 1

## 2018-10-05 ENCOUNTER — Encounter: Payer: Self-pay | Admitting: Cardiovascular Disease

## 2018-10-05 ENCOUNTER — Ambulatory Visit (INDEPENDENT_AMBULATORY_CARE_PROVIDER_SITE_OTHER): Payer: Medicare Other | Admitting: Cardiovascular Disease

## 2018-10-05 VITALS — BP 108/62 | HR 67 | Ht 71.5 in | Wt 200.4 lb

## 2018-10-05 DIAGNOSIS — S32050D Wedge compression fracture of fifth lumbar vertebra, subsequent encounter for fracture with routine healing: Secondary | ICD-10-CM

## 2018-10-05 DIAGNOSIS — I471 Supraventricular tachycardia: Secondary | ICD-10-CM

## 2018-10-05 DIAGNOSIS — I472 Ventricular tachycardia: Secondary | ICD-10-CM

## 2018-10-05 DIAGNOSIS — I4729 Other ventricular tachycardia: Secondary | ICD-10-CM

## 2018-10-05 DIAGNOSIS — Z950 Presence of cardiac pacemaker: Secondary | ICD-10-CM | POA: Diagnosis not present

## 2018-10-05 DIAGNOSIS — I4719 Other supraventricular tachycardia: Secondary | ICD-10-CM

## 2018-10-05 DIAGNOSIS — E7800 Pure hypercholesterolemia, unspecified: Secondary | ICD-10-CM

## 2018-10-05 DIAGNOSIS — D479 Neoplasm of uncertain behavior of lymphoid, hematopoietic and related tissue, unspecified: Secondary | ICD-10-CM

## 2018-10-05 DIAGNOSIS — I495 Sick sinus syndrome: Secondary | ICD-10-CM | POA: Diagnosis not present

## 2018-10-05 DIAGNOSIS — S32000D Wedge compression fracture of unspecified lumbar vertebra, subsequent encounter for fracture with routine healing: Secondary | ICD-10-CM | POA: Insufficient documentation

## 2018-10-05 NOTE — Patient Instructions (Signed)
Medication Instructions:  Dr Sallyanne Kuster recommends that you continue on your current medications as directed. Please refer to the Current Medication list given to you today.  If you need a refill on your cardiac medications before your next appointment, please call your pharmacy.   Testing/Procedures: Remote monitoring is used to monitor your Pacemaker of ICD from home. This monitoring reduces the number of office visits required to check your device to one time per year. It allows [place] to keep an eye on the functioning of your device to ensure it is working properly. You are scheduled for a device check from home on Tuesday, March 3rd, 2020. You may send your transmission at any time that day. If you have a wireless device, the transmission will be sent automatically. After your physician reviews your transmission, you will receive a postcard with your next transmission date. To improve our patient care and to more adequately follow your device, [facility] has decided, as a practice, to start following each patient four times a year with your home monitor. This means that you may experience a remote appointment that is close to an in-office appointment with your physician. Your insurance will apply at the same rate as other remote monitoring transmissions.  Follow-Up: At [facility], you and your health needs are our priority.  As part of our continuing mission to provide you with exceptional heart care, we have created designated Provider Care Teams.  These Care Teams include your primary Cardiologist (physician) and Advanced Practice Providers (APPs -  Physician Assistants and Nurse Practitioners) who all work together to provide you with the care you need, when you need it. You will need a follow up appointment in 12 months. You may see Sanda Klein, MD or one of the following Advanced Practice Providers on your designated Care Team: [place], [place] . Fabian Sharp, PA-C . You will receive a  reminder letter in the mail two months in advance. If you don't receive a letter, please call our office to schedule the follow-up appointment.

## 2018-10-05 NOTE — Progress Notes (Signed)
Cardiology Office Note    Date:  10/05/2018   ID:  David [name], DOB 1926/08/01, MRN [id number]  PCP:  [name], MD  Cardiologist:   Sanda Klein, MD   Chief Complaint  Patient presents with  . Follow-up  Pacemaker check  History of Present Illness:  David [name] is a 84 y.o. male with sinus node dysfunction, paroxysmal atrial tachycardia and nonsustained ventricular tachycardia returning for follow-up. He has a dual-chamber permanent pacemaker (implanted 2008, generator change Medtronic Azure September 2018).  Last year has been dominated by problems related to an L5 compression fracture accompanied by severe pain radiating to his right hip.  This has led to substantial reduction in his level of physical activity.  His pacemaker reflects this, with daily activity declining from roughly 2 hours a day to less than 1 hour a day.  He is using a walker.  He has undergone physical therapy with some improvement.  He has not had any cardiovascular complaints.  He has not had palpitations.  Activity level is very low, but for what is worse he denies exertional angina or dyspnea.  He has not had dizziness or syncope or leg edema.  He is noted to have occasional episodes of nonsustained ventricular tachycardia.  Only 2 of these have occurred in the last year, the longer lasting 12 beats.  He has 83% atrial pacing with good atrial heart rate histograms and and he does not require ventricular pacing.  Battery longevity is estimated at 12.8 years.  Lead parameters are stable, unchanged since implantation.  Both the atrial and the ventricular lead are 5076, MRI compatible leads.  He has not had any recent bleeding problems or falls. In the remote past she did have a subdural hematoma while on warfarin therapy for pulmonary embolism after knee replacement surgery. He still has an inferior vena cava filter in place and is on maintenance warfarin therapy. Followed by Dr. 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  Previous cardiac workup: Echo 2011, myocardial perfusion study 2007 have not shown evidence of structural cardiac illness.  Past Medical History:  Diagnosis Date  . Dyslipidemia   . Kyphosis   . Nonsustained ventricular tachycardia ([place])   . Osteoporosis    Severe  . Paroxysmal atrial fibrillation (HCC)   . Pulmonary embolism ([place])    following a hip fracture and surgery. 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 Sinus node dysfunction (HCC)   . 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 Tachycardia-bradycardia syndrome ([place]) 2008   a. s/p MDT dual chamber PPM     Past Surgical History:  Procedure Laterality Date  . PACEMAKER INSERTION     Medtronic Adapta model #ADDRO1, serial [id number] H  . PPM GENERATOR CHANGEOUT N/A 07/07/2017   Procedure: PPM GENERATOR CHANGEOUT;  Surgeon: Sanda Klein, MD;  Location: [facility];  Service: Cardiovascular;  Laterality: N/A;  . TOTAL HIP ARTHROPLASTY  2004    Current Medications: Outpatient Medications Prior to Visit  Medication Sig Dispense Refill  . acetaminophen (TYLENOL) 325 MG tablet Take 325-650 mg by mouth daily as needed for moderate pain.    Marland Kitchen alfuzosin (UROXATRAL) 10 MG 24 hr tablet Take 10 mg by mouth every other day.  0  . allopurinol (ZYLOPRIM) 100 MG tablet Take 100 mg by mouth daily.   0  . Calcium-Vitamin D-Vitamin K (CALCIUM + D + K PO) Take  500 mg by mouth daily.    . Cholecalciferol (VITAMIN D) 2000 UNITS tablet Take 2,000 Units by mouth daily.    . Coenzyme Q10 (CO Q-10) 100 MG CAPS Take 100 mg by mouth daily.     Marland Kitchen EPINEPHrine 0.3 mg/0.3 mL IJ SOAJ injection Inject 0.3 mg into the muscle as needed (allergic reaction).     . fexofenadine (ALLEGRA) 180 MG tablet Take 180 mg by mouth daily as needed for allergies.     Marland Kitchen levothyroxine (SYNTHROID, LEVOTHROID) 25 MCG tablet Take 25 mcg by mouth daily.  1  .  Multiple Vitamins-Minerals (PRESERVISION AREDS) CAPS Take 1 capsule by mouth 2 (two) times daily.     Marland Kitchen neomycin-bacitracin-polymyxin (NEOSPORIN) ointment Apply 1 application topically daily as needed for wound care.    . simvastatin (ZOCOR) 40 MG tablet Take 40 mg by mouth daily.  1  . sodium chloride (OCEAN) 0.65 % SOLN nasal spray Place 1 spray into both nostrils as needed for congestion.    . vitamin B-12 (CYANOCOBALAMIN) 1000 MCG tablet Take 1,000 mcg by mouth daily.    Marland Kitchen warfarin (COUMADIN) 5 MG tablet Take 5-7.5 mg by mouth daily. Take 5 mg daily except on Wed take 7.5 mg daily  0  . Zoster Vaccine Adjuvanted [facility]) injection Shingrix (PF) 50 mcg/0.5 mL intramuscular suspension, kit  inject 0.5 milliliter intramuscularly    . Besifloxacin HCl (BESIVANCE) 0.6 % SUSP Besivance 0.6 % eye drops,suspension  instill 1 drop into left eye four times a day STARTING 2 DAYS BEF...  (REFER TO PRESCRIPTION NOTES).    . Difluprednate (DUREZOL) 0.05 % EMUL Durezol 0.05 % eye drops  instill 1 drop into left eye four times a day STARTING AFTER SURGERY     No facility-administered medications prior to visit.      Allergies:   Bee venom   Social History   Socioeconomic History  . Marital status: Married    Spouse name: Not on file  . Number of children: Not on file  . Years of education: Not on file  . Highest education level: Not on file  Occupational History  . Not on file  Social Needs  . Financial resource strain: Not on file  . Food insecurity:    Worry: Not on file    Inability: Not on file  . Transportation needs:    Medical: Not on file    Non-medical: Not on file  Tobacco Use  . Smoking status: Former Smoker    Last attempt to quit: 09/25/1984    Years since quitting: 34.0  . Smokeless tobacco: Never Used  Substance and Sexual Activity  . Alcohol use: Yes  . Drug use: No  . Sexual activity: Never  Lifestyle  . Physical activity:    Days per week: Not on file    Minutes  per session: Not on file  . Stress: Not on file  Relationships  . Social connections:    Talks on phone: Not on file    Gets together: Not on file    Attends religious service: Not on file    Active member of club or organization: Not on file    Attends meetings of clubs or organizations: Not on file    Relationship status: Not on file  Other Topics Concern  . Not on file  Social History Narrative  . Not on file     Family History:  The patient's family history includes Fainting in his sister; Heart attack in  his father; Stroke in his maternal grandfather, maternal grandmother, and mother.   ROS:   Please see the history of present illness.    ROS all other systems are reviewed and are negative   PHYSICAL EXAM:   VS:  BP 108/62   Pulse 67   Ht 5' 11.5" (1.816 m)   Wt 200 lb 6.4 oz (90.9 kg)   BMI 27.56 kg/m      General: Alert, oriented x3, no distress, very hard of hearing Head: no evidence of trauma, PERRL, EOMI, no exophtalmos or lid lag, no myxedema, no xanthelasma; normal ears, nose and oropharynx Neck: normal jugular venous pulsations and no hepatojugular reflux; brisk carotid pulses without delay and no carotid bruits Chest: clear to auscultation, no signs of consolidation by percussion or palpation, normal fremitus, symmetrical and full respiratory excursions.  Healthy left subclavian pacemaker site Cardiovascular: normal position and quality of the apical impulse, regular rhythm, normal first and second heart sounds, no murmurs, rubs or gallops Abdomen: no tenderness or distention, no masses by palpation, no abnormal pulsatility or arterial bruits, normal bowel sounds, no hepatosplenomegaly Extremities: no clubbing, cyanosis or edema; 2+ radial, ulnar and brachial pulses bilaterally; 2+ right femoral, posterior tibial and dorsalis pedis pulses; 2+ left femoral, posterior tibial and dorsalis pedis pulses; no subclavian or femoral bruits Neurological: grossly  nonfocal Psych: Normal mood and affect    Wt Readings from Last 3 Encounters:  10/05/18 200 lb 6.4 oz (90.9 kg)  07/07/18 196 lb 4.8 oz (89 kg)  04/06/18 199 lb 4.8 oz (90.4 kg)      Studies/Labs Reviewed:   EKG:  EKG is ordered today.  It shows atrial paced, ventricular sensed rhythm with long AV delay around 275 ms.  No repolarization abnormalities.  QTc 409 ms  Recent Labs: 07/07/2018: ALT 7; BUN 21; Creatinine 1.28; Hemoglobin 10.7; Platelet Count 120; Potassium 4.6; Sodium 142   Lipid profile a few weeks ago from Dr. Joylene Draft Total cholesterol 118, HDL 37, 64 triglyceride 85  Additional studies/ records that were reviewed today include:  Notes from oncology clinic visit on September 12  ASSESSMENT:    1. SSS (sick sinus syndrome) ([place])   2. Pacemaker   3. PAT (paroxysmal atrial tachycardia) ([place])   4. NSVT (nonsustained ventricular tachycardia) ([place])   5. Hypercholesterolemia   6. Lymphoproliferative disorder ([place])   7. Compression fracture of L5 vertebra with routine healing, subsequent encounter      PLAN:  In order of problems listed above:  1. SSS: He is not pacemaker dependent.  Heart rate histogram distribution is appropriate. 2. PM: Normal device function.  He has an MRI conditional device and can have scans with appropriate precautions. 3. PAT: None seen since pacemaker change , more than 1 year ago. 4. NSVT: 2 brief asymptomatic episodes in the last 12 months. 5. History of pulmonary embolism and chronic inferior vena cava filter: Continue long-term warfarin anticoagulation.  Bleeding complications have not occurred. 6. HLP: On statin.  Parameters are acceptable on recent labs. 7. Lymphoproliferative disorder, splenic marginal zone lymphoma.  His last appointment with Dr. Julien Nordmann was in September.  He has leukocytosis, mild anemia and thrombocytopenia and the plan remains unchanged, observation only. 8. Lumbar spine compression fracture: This is his major  problem leading to functional limitation..    Medication Adjustments/Labs and Tests Ordered: Current medicines are reviewed at length with the patient today.  Concerns regarding medicines are outlined above.  Medication changes, Labs and Tests ordered today  are listed in the Patient Instructions below. Patient Instructions  Medication Instructions:  Dr Sallyanne Kuster recommends that you continue on your current medications as directed. Please refer to the Current Medication list given to you today.  If you need a refill on your cardiac medications before your next appointment, please call your pharmacy.   Testing/Procedures: Remote monitoring is used to monitor your Pacemaker of ICD from home. This monitoring reduces the number of office visits required to check your device to one time per year. It allows [place] to keep an eye on the functioning of your device to ensure it is working properly. You are scheduled for a device check from home on Tuesday, March 3rd, 2020. You may send your transmission at any time that day. If you have a wireless device, the transmission will be sent automatically. After your physician reviews your transmission, you will receive a postcard with your next transmission date. To improve our patient care and to more adequately follow your device, [facility] has decided, as a practice, to start following each patient four times a year with your home monitor. This means that you may experience a remote appointment that is close to an in-office appointment with your physician. Your insurance will apply at the same rate as other remote monitoring transmissions.  Follow-Up: At [facility], you and your health needs are our priority.  As part of our continuing mission to provide you with exceptional heart care, we have created designated Provider Care Teams.  These Care Teams include your primary Cardiologist (physician) and Advanced Practice Providers (APPs -  Physician Assistants  and Nurse Practitioners) who all work together to provide you with the care you need, when you need it. You will need a follow up appointment in 12 months. You may see Sanda Klein, MD or one of the following Advanced Practice Providers on your designated Care Team: [place], [place] . Fabian Sharp, PA-C . You will receive a reminder letter in the mail two months in advance. If you don't receive a letter, please call our office to schedule the follow-up appointment.    Signed, Sanda Klein, MD  10/05/2018 1:04 PM    [facility] [address], [place], [place]  [address] Phone: [phone]; Fax: [phone]

## 2018-10-26 LAB — CUP PACEART REMOTE DEVICE CHECK
Brady Statistic AP VP Percent: 0.07 %
Brady Statistic AP VS Percent: 85.3 %
Brady Statistic AS VP Percent: 0.01 %
Brady Statistic RA Percent Paced: 86.52 %
Brady Statistic RV Percent Paced: 0.08 %
Implantable Lead Implant Date: 20080930
Implantable Lead Model: 5076
Implantable Lead Model: 5076
Lead Channel Impedance Value: 266 Ohm
Lead Channel Impedance Value: 304 Ohm
Lead Channel Impedance Value: 475 Ohm
Lead Channel Pacing Threshold Amplitude: 1 V
Lead Channel Pacing Threshold Pulse Width: 0.4 ms
Lead Channel Pacing Threshold Pulse Width: 0.4 ms
Lead Channel Sensing Intrinsic Amplitude: 16.25 mV
Lead Channel Sensing Intrinsic Amplitude: 16.25 mV
Lead Channel Setting Pacing Amplitude: 1.5 V
Lead Channel Setting Pacing Amplitude: 2.5 V
Lead Channel Setting Pacing Pulse Width: 0.4 ms
MDC IDC LEAD IMPLANT DT: 20080930
MDC IDC LEAD LOCATION: 753859
MDC IDC LEAD LOCATION: 753860
MDC IDC MSMT BATTERY REMAINING LONGEVITY: 154 mo
MDC IDC MSMT BATTERY VOLTAGE: 3.06 V
MDC IDC MSMT LEADCHNL RA PACING THRESHOLD AMPLITUDE: 0.625 V
MDC IDC MSMT LEADCHNL RA SENSING INTR AMPL: 1.625 mV
MDC IDC MSMT LEADCHNL RA SENSING INTR AMPL: 1.625 mV
MDC IDC MSMT LEADCHNL RV IMPEDANCE VALUE: 399 Ohm
MDC IDC PG IMPLANT DT: 20180912
MDC IDC SESS DTM: 20191203053114
MDC IDC SET LEADCHNL RV SENSING SENSITIVITY: 2.8 mV
MDC IDC STAT BRADY AS VS PERCENT: 14.62 %

## 2018-11-15 LAB — CUP PACEART INCLINIC DEVICE CHECK
Implantable Lead Implant Date: 20080930
Implantable Lead Location: 753860
Implantable Lead Model: 5076
Implantable Pulse Generator Implant Date: 20180912
MDC IDC LEAD IMPLANT DT: 20080930
MDC IDC LEAD LOCATION: 753859
MDC IDC SESS DTM: 20200121151454

## 2018-12-27 ENCOUNTER — Ambulatory Visit (INDEPENDENT_AMBULATORY_CARE_PROVIDER_SITE_OTHER): Payer: Medicare Other | Admitting: *Deleted

## 2018-12-27 DIAGNOSIS — I471 Supraventricular tachycardia: Secondary | ICD-10-CM

## 2018-12-27 DIAGNOSIS — I495 Sick sinus syndrome: Secondary | ICD-10-CM

## 2018-12-28 LAB — CUP PACEART REMOTE DEVICE CHECK
Battery Voltage: 3.04 V
Brady Statistic AP VP Percent: 0.09 %
Brady Statistic RA Percent Paced: 86.06 %
Date Time Interrogation Session: 20200303053340
Implantable Lead Implant Date: 20080930
Implantable Lead Location: 753859
Implantable Lead Location: 753860
Implantable Lead Model: 5076
Lead Channel Impedance Value: 266 Ohm
Lead Channel Impedance Value: 304 Ohm
Lead Channel Pacing Threshold Amplitude: 0.625 V
Lead Channel Pacing Threshold Pulse Width: 0.4 ms
Lead Channel Pacing Threshold Pulse Width: 0.4 ms
Lead Channel Setting Pacing Pulse Width: 0.4 ms
MDC IDC LEAD IMPLANT DT: 20080930
MDC IDC MSMT BATTERY REMAINING LONGEVITY: 151 mo
MDC IDC MSMT LEADCHNL RA IMPEDANCE VALUE: 494 Ohm
MDC IDC MSMT LEADCHNL RA SENSING INTR AMPL: 1.125 mV
MDC IDC MSMT LEADCHNL RA SENSING INTR AMPL: 1.125 mV
MDC IDC MSMT LEADCHNL RV IMPEDANCE VALUE: 399 Ohm
MDC IDC MSMT LEADCHNL RV PACING THRESHOLD AMPLITUDE: 1.125 V
MDC IDC MSMT LEADCHNL RV SENSING INTR AMPL: 17.625 mV
MDC IDC MSMT LEADCHNL RV SENSING INTR AMPL: 17.625 mV
MDC IDC PG IMPLANT DT: 20180912
MDC IDC SET LEADCHNL RA PACING AMPLITUDE: 1.5 V
MDC IDC SET LEADCHNL RV PACING AMPLITUDE: 2.5 V
MDC IDC SET LEADCHNL RV SENSING SENSITIVITY: 2.8 mV
MDC IDC STAT BRADY AP VS PERCENT: 84.07 %
MDC IDC STAT BRADY AS VP PERCENT: 0.01 %
MDC IDC STAT BRADY AS VS PERCENT: 15.83 %
MDC IDC STAT BRADY RV PERCENT PACED: 0.1 %

## 2019-01-03 NOTE — Progress Notes (Signed)
Remote pacemaker transmission.   

## 2019-01-05 ENCOUNTER — Inpatient Hospital Stay: Payer: Medicare Other

## 2019-01-05 ENCOUNTER — Inpatient Hospital Stay: Payer: Medicare Other | Attending: Internal Medicine | Admitting: Internal Medicine

## 2019-01-05 ENCOUNTER — Encounter: Payer: Self-pay | Admitting: Internal Medicine

## 2019-01-05 ENCOUNTER — Other Ambulatory Visit: Payer: Self-pay

## 2019-01-05 ENCOUNTER — Telehealth: Payer: Self-pay | Admitting: Internal Medicine

## 2019-01-05 VITALS — BP 130/59 | HR 87 | Temp 98.5°F | Resp 18 | Ht 71.0 in | Wt 207.6 lb

## 2019-01-05 DIAGNOSIS — D479 Neoplasm of uncertain behavior of lymphoid, hematopoietic and related tissue, unspecified: Secondary | ICD-10-CM | POA: Diagnosis not present

## 2019-01-05 DIAGNOSIS — M818 Other osteoporosis without current pathological fracture: Secondary | ICD-10-CM | POA: Insufficient documentation

## 2019-01-05 DIAGNOSIS — D72829 Elevated white blood cell count, unspecified: Secondary | ICD-10-CM | POA: Diagnosis not present

## 2019-01-05 DIAGNOSIS — C8307 Small cell B-cell lymphoma, spleen: Secondary | ICD-10-CM

## 2019-01-05 DIAGNOSIS — C884 Extranodal marginal zone B-cell lymphoma of mucosa-associated lymphoid tissue [MALT-lymphoma]: Secondary | ICD-10-CM | POA: Insufficient documentation

## 2019-01-05 LAB — LACTATE DEHYDROGENASE: LDH: 214 U/L — ABNORMAL HIGH (ref 98–192)

## 2019-01-05 LAB — CMP (CANCER CENTER ONLY)
ALBUMIN: 3.5 g/dL (ref 3.5–5.0)
ALT: 8 U/L (ref 0–44)
ANION GAP: 9 (ref 5–15)
AST: 17 U/L (ref 15–41)
Alkaline Phosphatase: 68 U/L (ref 38–126)
BILIRUBIN TOTAL: 0.4 mg/dL (ref 0.3–1.2)
BUN: 22 mg/dL (ref 8–23)
CALCIUM: 8.1 mg/dL — AB (ref 8.9–10.3)
CO2: 24 mmol/L (ref 22–32)
Chloride: 110 mmol/L (ref 98–111)
Creatinine: 1.49 mg/dL — ABNORMAL HIGH (ref 0.61–1.24)
GFR, EST NON AFRICAN AMERICAN: 41 mL/min — AB (ref >60)
GFR, Est AFR Am: 47 mL/min — ABNORMAL LOW (ref >60)
GLUCOSE: 113 mg/dL — AB (ref 70–99)
Potassium: 4.6 mmol/L (ref 3.5–5.1)
Sodium: 143 mmol/L (ref 135–145)
TOTAL PROTEIN: 6.2 g/dL — AB (ref 6.5–8.1)

## 2019-01-05 LAB — CBC WITH DIFFERENTIAL (CANCER CENTER ONLY)
Abs Immature Granulocytes: 0.06 10*3/uL (ref 0.00–0.07)
BASOS PCT: 0 %
Basophils Absolute: 0.1 10*3/uL (ref 0.0–0.1)
EOS ABS: 0.5 10*3/uL (ref 0.0–0.5)
Eosinophils Relative: 1 %
HCT: 34.3 % — ABNORMAL LOW (ref 39.0–52.0)
HEMOGLOBIN: 10.6 g/dL — AB (ref 13.0–17.0)
Immature Granulocytes: 0 %
Lymphocytes Relative: 84 %
Lymphs Abs: 30.3 10*3/uL — ABNORMAL HIGH (ref 0.7–4.0)
MCH: 32.6 pg (ref 26.0–34.0)
MCHC: 30.9 g/dL (ref 30.0–36.0)
MCV: 105.5 fL — AB (ref 80.0–100.0)
MONO ABS: 2.3 10*3/uL — AB (ref 0.1–1.0)
MONOS PCT: 6 %
Neutro Abs: 3.3 10*3/uL (ref 1.7–7.7)
Neutrophils Relative %: 9 %
Platelet Count: 120 10*3/uL — ABNORMAL LOW (ref 150–400)
RBC: 3.25 MIL/uL — ABNORMAL LOW (ref 4.22–5.81)
RDW: 14.3 % (ref 11.5–15.5)
WBC Count: 36.5 10*3/uL — ABNORMAL HIGH (ref 4.0–10.5)
nRBC: 0 % (ref 0.0–0.2)

## 2019-01-05 NOTE — Progress Notes (Signed)
[facility] [phone]   Fax:([phone]) [place], MD [place] [address]  DIAGNOSIS: lymphoproliferative disorder suspicious for splenic marginal zone lymphoma.  PRIOR THERAPY: None  CURRENT THERAPY: Observation.  INTERVAL HISTORY: David [name] 85 y.o. male returns to the clinic today for follow-up visit accompanied by his daughter.  The patient is feeling fine today with no concerning complaints except for mild fatigue.  He denied having any current chest pain, shortness of breath, cough or hemoptysis.  He has no recent weight loss or night sweats.  He has no palpable lymphadenopathy.  He has no bleeding issues.  He is here today for evaluation and repeat blood work.  MEDICAL HISTORY: Past Medical History:  Diagnosis Date  . Dyslipidemia   . Kyphosis   . Nonsustained ventricular tachycardia ([place])   . Osteoporosis    Severe  . Paroxysmal atrial fibrillation (HCC)   . Pulmonary embolism ([place])    following a hip fracture and surgery. 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 Sinus node dysfunction (HCC)   . 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 Tachycardia-bradycardia syndrome ([place]) 2008   a. s/p MDT dual chamber PPM     ALLERGIES:  is allergic to bee venom.  MEDICATIONS:  Current Outpatient Medications  Medication Sig Dispense Refill  . acetaminophen (TYLENOL) 325 MG tablet Take 325-650 mg by mouth daily as needed for moderate pain.    Marland Kitchen alfuzosin (UROXATRAL) 10 MG 24 hr tablet Take 10 mg by mouth every other day.  0  . allopurinol (ZYLOPRIM) 100 MG tablet Take 100 mg by mouth daily.   0  . Calcium-Vitamin D-Vitamin K (CALCIUM + D + K PO) Take 500 mg by mouth daily.    . Cholecalciferol (VITAMIN D) 2000 UNITS tablet Take 2,000 Units by mouth daily.    . Coenzyme Q10 (CO Q-10) 100 MG CAPS Take 100 mg by mouth daily.     Marland Kitchen  EPINEPHrine 0.3 mg/0.3 mL IJ SOAJ injection Inject 0.3 mg into the muscle as needed (allergic reaction).     . fexofenadine (ALLEGRA) 180 MG tablet Take 180 mg by mouth daily as needed for allergies.     Marland Kitchen levothyroxine (SYNTHROID, LEVOTHROID) 25 MCG tablet Take 25 mcg by mouth daily.  1  . Multiple Vitamins-Minerals (PRESERVISION AREDS) CAPS Take 1 capsule by mouth 2 (two) times daily.     Marland Kitchen neomycin-bacitracin-polymyxin (NEOSPORIN) ointment Apply 1 application topically daily as needed for wound care.    . simvastatin (ZOCOR) 40 MG tablet Take 40 mg by mouth daily.  1  . sodium chloride (OCEAN) 0.65 % SOLN nasal spray Place 1 spray into both nostrils as needed for congestion.    . vitamin B-12 (CYANOCOBALAMIN) 1000 MCG tablet Take 1,000 mcg by mouth daily.    Marland Kitchen warfarin (COUMADIN) 5 MG tablet Take 5-7.5 mg by mouth daily. Take 5 mg daily except on Wed take 7.5 mg daily  0  . Zoster Vaccine Adjuvanted [facility]) injection Shingrix (PF) 50 mcg/0.5 mL intramuscular suspension, kit  inject 0.5 milliliter intramuscularly     No current facility-administered medications for this visit.     SURGICAL HISTORY:  Past Surgical History:  Procedure Laterality Date  . PACEMAKER INSERTION     Medtronic Adapta model #ADDRO1, serial [id number] H  . PPM GENERATOR CHANGEOUT N/A 07/07/2017   Procedure: PPM GENERATOR  CHANGEOUT;  Surgeon: Sanda Klein, MD;  Location: [facility];  Service: Cardiovascular;  Laterality: N/A;  . TOTAL HIP ARTHROPLASTY  2004    REVIEW OF SYSTEMS:  A comprehensive review of systems was negative except for: Constitutional: positive for fatigue Musculoskeletal: positive for arthralgias and muscle weakness   PHYSICAL EXAMINATION: General appearance: alert, cooperative, fatigued and no distress Head: Normocephalic, without obvious abnormality, atraumatic Neck: no adenopathy, no JVD, supple, symmetrical, trachea midline and thyroid not enlarged, symmetric, no  tenderness/mass/nodules Lymph nodes: Cervical, supraclavicular, and axillary nodes normal. Resp: clear to auscultation bilaterally Back: symmetric, no curvature. ROM normal. No CVA tenderness. Cardio: regular rate and rhythm, S1, S2 normal, no murmur, click, rub or gallop GI: soft, non-tender; bowel sounds normal; no masses,  no organomegaly Extremities: extremities normal, atraumatic, no cyanosis or edema  ECOG PERFORMANCE STATUS: 1 - Symptomatic but completely ambulatory  Blood pressure (!) 130/59, pulse 87, temperature 98.5 F (36.9 C), temperature source Oral, resp. rate 18, height _0  (1.803 m), weight 207 lb 9.6 oz (94.2 kg), SpO2 97 %.  LABORATORY DATA: Lab Results  Component Value Date   WBC 36.5 (H) 01/05/2019   HGB 10.6 (L) 01/05/2019   HCT 34.3 (L) 01/05/2019   MCV 105.5 (H) 01/05/2019   PLT 120 (L) 01/05/2019      Chemistry      Component Value Date/Time   NA 143 01/05/2019 1024   NA 140 07/08/2017 1022   K 4.6 01/05/2019 1024   K 4.3 07/08/2017 1022   CL 110 01/05/2019 1024   CO2 24 01/05/2019 1024   CO2 23 07/08/2017 1022   BUN 22 01/05/2019 1024   BUN 29.0 (H) 07/08/2017 1022   CREATININE 1.49 (H) 01/05/2019 1024   CREATININE 1.6 (H) 07/08/2017 1022      Component Value Date/Time   CALCIUM 8.1 (L) 01/05/2019 1024   CALCIUM 8.9 07/08/2017 1022   ALKPHOS 68 01/05/2019 1024   ALKPHOS 60 07/08/2017 1022   AST 17 01/05/2019 1024   AST 19 07/08/2017 1022   ALT 8 01/05/2019 1024   ALT 7 07/08/2017 1022   BILITOT 0.4 01/05/2019 1024   BILITOT 0.44 07/08/2017 1022       RADIOGRAPHIC STUDIES: No results found.  ASSESSMENT AND PLAN:  This is a very pleasant 85 years old white male with myeloproliferative disorder consistent with splenic marginal zone lymphoma and leukocytosis. The patient is currently on observation and he is feeling fine. Repeat CBC performed earlier today showed further increase in his total white blood count.  The patient is still  asymptomatic except for the mild fatigue from the anemia. I recommended for him to continue on observation with repeat CBC, comprehensive metabolic panel and LDH in 6 months. He was advised to call immediately if he has any other concerning symptoms in the interval. The patient voices understanding of current disease status and treatment options and is in agreement with the current care plan. All questions were answered. The patient knows to call the clinic with any problems, questions or concerns. We can certainly see the patient much sooner if necessary. I spent 10 minutes counseling the patient face to face. The total time spent in the appointment was 15 minutes.  Disclaimer: This note was dictated with voice recognition software. Similar sounding words can inadvertently be transcribed and may not be corrected upon review.

## 2019-01-05 NOTE — Telephone Encounter (Signed)
Gave avs and calendar ° °

## 2019-03-28 ENCOUNTER — Ambulatory Visit (INDEPENDENT_AMBULATORY_CARE_PROVIDER_SITE_OTHER): Payer: Medicare Other | Admitting: *Deleted

## 2019-03-28 DIAGNOSIS — I495 Sick sinus syndrome: Secondary | ICD-10-CM | POA: Diagnosis not present

## 2019-03-28 LAB — CUP PACEART REMOTE DEVICE CHECK
Battery Remaining Longevity: 148 mo
Battery Voltage: 3.03 V
Brady Statistic AP VP Percent: 0.07 %
Brady Statistic AP VS Percent: 85.35 %
Brady Statistic AS VP Percent: 0.01 %
Brady Statistic AS VS Percent: 14.57 %
Brady Statistic RA Percent Paced: 87.06 %
Brady Statistic RV Percent Paced: 0.08 %
Date Time Interrogation Session: 20200602054629
Implantable Lead Implant Date: 20080930
Implantable Lead Implant Date: 20080930
Implantable Lead Location: 753859
Implantable Lead Location: 753860
Implantable Lead Model: 5076
Implantable Lead Model: 5076
Implantable Pulse Generator Implant Date: 20180912
Lead Channel Impedance Value: 285 Ohm
Lead Channel Impedance Value: 323 Ohm
Lead Channel Impedance Value: 399 Ohm
Lead Channel Impedance Value: 494 Ohm
Lead Channel Pacing Threshold Amplitude: 0.625 V
Lead Channel Pacing Threshold Amplitude: 1.25 V
Lead Channel Pacing Threshold Pulse Width: 0.4 ms
Lead Channel Pacing Threshold Pulse Width: 0.4 ms
Lead Channel Sensing Intrinsic Amplitude: 1.625 mV
Lead Channel Sensing Intrinsic Amplitude: 1.625 mV
Lead Channel Sensing Intrinsic Amplitude: 18.125 mV
Lead Channel Sensing Intrinsic Amplitude: 18.125 mV
Lead Channel Setting Pacing Amplitude: 1.5 V
Lead Channel Setting Pacing Amplitude: 2.5 V
Lead Channel Setting Pacing Pulse Width: 0.4 ms
Lead Channel Setting Sensing Sensitivity: 2.8 mV

## 2019-03-30 ENCOUNTER — Other Ambulatory Visit (HOSPITAL_COMMUNITY): Payer: Self-pay | Admitting: *Deleted

## 2019-03-31 ENCOUNTER — Ambulatory Visit (HOSPITAL_COMMUNITY)
Admission: RE | Admit: 2019-03-31 | Discharge: 2019-03-31 | Disposition: A | Discharge status: Home / Self Care | Payer: Medicare Other | Source: Ambulatory Visit | Attending: Internal Medicine | Admitting: Internal Medicine

## 2019-03-31 ENCOUNTER — Other Ambulatory Visit: Payer: Self-pay

## 2019-03-31 DIAGNOSIS — M810 Age-related osteoporosis without current pathological fracture: Secondary | ICD-10-CM | POA: Diagnosis present

## 2019-03-31 MED ORDER — DENOSUMAB 60 MG/ML ~~LOC~~ SOSY
PREFILLED_SYRINGE | SUBCUTANEOUS | Status: AC
  Filled 2019-03-31: qty 1

## 2019-03-31 MED ORDER — DENOSUMAB 60 MG/ML ~~LOC~~ SOSY
60.0000 mg | PREFILLED_SYRINGE | Freq: Once | SUBCUTANEOUS | Status: AC
  Administered 2019-03-31: 60 mg via SUBCUTANEOUS

## 2019-04-05 ENCOUNTER — Encounter: Payer: Self-pay | Admitting: Cardiology

## 2019-04-05 NOTE — Progress Notes (Signed)
Remote pacemaker transmission.   

## 2019-06-27 ENCOUNTER — Ambulatory Visit (INDEPENDENT_AMBULATORY_CARE_PROVIDER_SITE_OTHER): Payer: Medicare Other | Admitting: *Deleted

## 2019-06-27 DIAGNOSIS — I495 Sick sinus syndrome: Secondary | ICD-10-CM | POA: Diagnosis not present

## 2019-06-27 DIAGNOSIS — I471 Supraventricular tachycardia: Secondary | ICD-10-CM

## 2019-06-27 LAB — CUP PACEART REMOTE DEVICE CHECK
Battery Remaining Longevity: 145 mo
Battery Voltage: 3.03 V
Brady Statistic AP VP Percent: 0.09 %
Brady Statistic AP VS Percent: 88.25 %
Brady Statistic AS VP Percent: 0.01 %
Brady Statistic AS VS Percent: 11.65 %
Brady Statistic RA Percent Paced: 89.1 %
Brady Statistic RV Percent Paced: 0.1 %
Date Time Interrogation Session: 20200901055003
Implantable Lead Implant Date: 20080930
Implantable Lead Implant Date: 20080930
Implantable Lead Location: 753859
Implantable Lead Location: 753860
Implantable Lead Model: 5076
Implantable Lead Model: 5076
Implantable Pulse Generator Implant Date: 20180912
Lead Channel Impedance Value: 304 Ohm
Lead Channel Impedance Value: 323 Ohm
Lead Channel Impedance Value: 399 Ohm
Lead Channel Impedance Value: 494 Ohm
Lead Channel Pacing Threshold Amplitude: 0.625 V
Lead Channel Pacing Threshold Amplitude: 1.5 V
Lead Channel Pacing Threshold Pulse Width: 0.4 ms
Lead Channel Pacing Threshold Pulse Width: 0.4 ms
Lead Channel Sensing Intrinsic Amplitude: 18.25 mV
Lead Channel Sensing Intrinsic Amplitude: 18.25 mV
Lead Channel Sensing Intrinsic Amplitude: 2 mV
Lead Channel Sensing Intrinsic Amplitude: 2 mV
Lead Channel Setting Pacing Amplitude: 1.5 V
Lead Channel Setting Pacing Amplitude: 3 V
Lead Channel Setting Pacing Pulse Width: 0.4 ms
Lead Channel Setting Sensing Sensitivity: 2.8 mV

## 2019-07-10 ENCOUNTER — Telehealth: Payer: Self-pay | Admitting: Medical Oncology

## 2019-07-10 NOTE — Telephone Encounter (Signed)
Confirmed appt. And to call dtr during visit.

## 2019-07-11 ENCOUNTER — Inpatient Hospital Stay: Payer: Medicare Other

## 2019-07-11 ENCOUNTER — Inpatient Hospital Stay: Payer: Medicare Other | Attending: Internal Medicine | Admitting: Internal Medicine

## 2019-07-11 ENCOUNTER — Other Ambulatory Visit: Payer: Self-pay

## 2019-07-11 ENCOUNTER — Encounter: Payer: Self-pay | Admitting: Internal Medicine

## 2019-07-11 VITALS — BP 128/60 | HR 78 | Temp 98.9°F | Resp 18 | Ht 71.0 in | Wt 200.4 lb

## 2019-07-11 DIAGNOSIS — E785 Hyperlipidemia, unspecified: Secondary | ICD-10-CM | POA: Insufficient documentation

## 2019-07-11 DIAGNOSIS — M2550 Pain in unspecified joint: Secondary | ICD-10-CM | POA: Insufficient documentation

## 2019-07-11 DIAGNOSIS — Z86711 Personal history of pulmonary embolism: Secondary | ICD-10-CM | POA: Insufficient documentation

## 2019-07-11 DIAGNOSIS — R5383 Other fatigue: Secondary | ICD-10-CM | POA: Insufficient documentation

## 2019-07-11 DIAGNOSIS — Z7901 Long term (current) use of anticoagulants: Secondary | ICD-10-CM | POA: Diagnosis not present

## 2019-07-11 DIAGNOSIS — C8307 Small cell B-cell lymphoma, spleen: Secondary | ICD-10-CM

## 2019-07-11 DIAGNOSIS — I480 Paroxysmal atrial fibrillation: Secondary | ICD-10-CM | POA: Insufficient documentation

## 2019-07-11 DIAGNOSIS — Z79899 Other long term (current) drug therapy: Secondary | ICD-10-CM | POA: Insufficient documentation

## 2019-07-11 DIAGNOSIS — D72829 Elevated white blood cell count, unspecified: Secondary | ICD-10-CM | POA: Diagnosis not present

## 2019-07-11 LAB — CBC WITH DIFFERENTIAL (CANCER CENTER ONLY)
Abs Immature Granulocytes: 0.08 10*3/uL — ABNORMAL HIGH (ref 0.00–0.07)
Basophils Absolute: 0 10*3/uL (ref 0.0–0.1)
Basophils Relative: 0 %
Eosinophils Absolute: 0.5 10*3/uL (ref 0.0–0.5)
Eosinophils Relative: 1 %
HCT: 33.4 % — ABNORMAL LOW (ref 39.0–52.0)
Hemoglobin: 10.7 g/dL — ABNORMAL LOW (ref 13.0–17.0)
Immature Granulocytes: 0 %
Lymphocytes Relative: 87 %
Lymphs Abs: 41.3 10*3/uL — ABNORMAL HIGH (ref 0.7–4.0)
MCH: 33.4 pg (ref 26.0–34.0)
MCHC: 32 g/dL (ref 30.0–36.0)
MCV: 104.4 fL — ABNORMAL HIGH (ref 80.0–100.0)
Monocytes Absolute: 2 10*3/uL — ABNORMAL HIGH (ref 0.1–1.0)
Monocytes Relative: 4 %
Neutro Abs: 3.8 10*3/uL (ref 1.7–7.7)
Neutrophils Relative %: 8 %
Platelet Count: 100 10*3/uL — ABNORMAL LOW (ref 150–400)
RBC: 3.2 MIL/uL — ABNORMAL LOW (ref 4.22–5.81)
RDW: 14.5 % (ref 11.5–15.5)
WBC Count: 47.6 10*3/uL — ABNORMAL HIGH (ref 4.0–10.5)
nRBC: 0 % (ref 0.0–0.2)

## 2019-07-11 LAB — CMP (CANCER CENTER ONLY)
ALT: 6 U/L (ref 0–44)
AST: 17 U/L (ref 15–41)
Albumin: 3.7 g/dL (ref 3.5–5.0)
Alkaline Phosphatase: 66 U/L (ref 38–126)
Anion gap: 6 (ref 5–15)
BUN: 23 mg/dL (ref 8–23)
CO2: 26 mmol/L (ref 22–32)
Calcium: 9.2 mg/dL (ref 8.9–10.3)
Chloride: 110 mmol/L (ref 98–111)
Creatinine: 1.64 mg/dL — ABNORMAL HIGH (ref 0.61–1.24)
GFR, Est AFR Am: 42 mL/min — ABNORMAL LOW (ref >60)
GFR, Estimated: 36 mL/min — ABNORMAL LOW (ref >60)
Glucose, Bld: 76 mg/dL (ref 70–99)
Potassium: 4.3 mmol/L (ref 3.5–5.1)
Sodium: 142 mmol/L (ref 135–145)
Total Bilirubin: 0.4 mg/dL (ref 0.3–1.2)
Total Protein: 6.1 g/dL — ABNORMAL LOW (ref 6.5–8.1)

## 2019-07-11 LAB — LACTATE DEHYDROGENASE: LDH: 184 U/L (ref 98–192)

## 2019-07-11 NOTE — Progress Notes (Signed)
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  PRIOR THERAPY: None  CURRENT THERAPY: Observation.  INTERVAL HISTORY: David [name] 85 y.o. male returns to the clinic today for follow-up visit.  The patient is feeling fine today with no concerning complaints except for mild fatigue and arthralgia.  He has compression fracture of the lower lumbar spines.  He denied having any chest pain, shortness of breath, cough or hemoptysis.  He denied having any fever or chills.  He has no nausea, vomiting, diarrhea or constipation.  He has no headache or visual changes.  He is here today for evaluation and repeat blood work.   MEDICAL HISTORY: Past Medical History:  Diagnosis Date  . Dyslipidemia   . Kyphosis   . Nonsustained ventricular tachycardia ([facility])   . Osteoporosis    Severe  . Paroxysmal atrial fibrillation (HCC)   . Pulmonary embolism ([facility])    following a hip fracture and surgery. 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 Sinus node dysfunction (HCC)   . 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 Tachycardia-bradycardia syndrome ([facility]) 2008   a. s/p MDT dual chamber PPM     ALLERGIES:  is allergic to bee venom.  MEDICATIONS:  Current Outpatient Medications  Medication Sig Dispense Refill  . acetaminophen (TYLENOL) 325 MG tablet Take 325-650 mg by mouth daily as needed for moderate pain.    Marland Kitchen alfuzosin (UROXATRAL) 10 MG 24 hr tablet Take 10 mg by mouth every other day. Every third day  0  . allopurinol (ZYLOPRIM) 100 MG tablet Take 100 mg by mouth daily.   0  . Calcium-Vitamin D-Vitamin K (CALCIUM + D + K PO) Take 500 mg by mouth daily.    . Cholecalciferol (VITAMIN D) 2000 UNITS tablet Take 2,000 Units by mouth daily.     . Coenzyme Q10 (CO Q-10) 100 MG CAPS Take 100 mg by mouth daily.     Marland Kitchen EPINEPHrine 0.3 mg/0.3 mL IJ SOAJ injection Inject 0.3 mg into the muscle as needed (allergic reaction).     . fexofenadine (ALLEGRA) 180 MG tablet Take 180 mg by mouth daily as needed for allergies.     Marland Kitchen levothyroxine (SYNTHROID, LEVOTHROID) 25 MCG tablet Take 50 mcg by mouth daily.   1  . Multiple Vitamins-Minerals (PRESERVISION AREDS) CAPS Take 1 capsule by mouth 2 (two) times daily.     Marland Kitchen neomycin-bacitracin-polymyxin (NEOSPORIN) ointment Apply 1 application topically daily as needed for wound care.    . simvastatin (ZOCOR) 40 MG tablet Take 40 mg by mouth daily.  1  . sodium chloride (OCEAN) 0.65 % SOLN nasal spray Place 1 spray into both nostrils as needed for congestion.    . vitamin B-12 (CYANOCOBALAMIN) 1000 MCG tablet Take 1,000 mcg by mouth daily.    Marland Kitchen warfarin (COUMADIN) 5 MG tablet Take 5-7.5 mg by mouth daily.   0  . Zoster Vaccine Adjuvanted (SHINGRIX) injection Shingrix (PF) 50 mcg/0.5 mL intramuscular suspension, kit  inject 0.5 milliliter intramuscularly     No current facility-administered medications for this visit.     SURGICAL HISTORY:  Past Surgical History:  Procedure Laterality Date  . PACEMAKER INSERTION     Medtronic Adapta model #ADDRO1, serial [id number]  . PPM GENERATOR CHANGEOUT N/A 07/07/2017  Procedure: PPM GENERATOR CHANGEOUT;  Surgeon: Sanda Klein, MD;  Location: [facility];  Service: Cardiovascular;  Laterality: N/A;  . TOTAL HIP ARTHROPLASTY  2004    REVIEW OF SYSTEMS:  A comprehensive review of systems was negative except for: Constitutional: positive for fatigue Musculoskeletal: positive for arthralgias and muscle weakness   PHYSICAL EXAMINATION: General appearance: alert, cooperative, fatigued and no distress Head: Normocephalic, without obvious abnormality, atraumatic Neck: no adenopathy, no JVD, supple, symmetrical, trachea midline and thyroid not enlarged,  symmetric, no tenderness/mass/nodules Lymph nodes: Cervical, supraclavicular, and axillary nodes normal. Resp: clear to auscultation bilaterally Back: symmetric, no curvature. ROM normal. No CVA tenderness. Cardio: regular rate and rhythm, S1, S2 normal, no murmur, click, rub or gallop GI: soft, non-tender; bowel sounds normal; no masses,  no organomegaly Extremities: extremities normal, atraumatic, no cyanosis or edema  ECOG PERFORMANCE STATUS: 1 - Symptomatic but completely ambulatory  Blood pressure 128/60, pulse 78, temperature 98.9 F (37.2 C), resp. rate 18, height 5' 11" (1.803 m), weight 200 lb 6.4 oz (90.9 kg), SpO2 97 %.  LABORATORY DATA: Lab Results  Component Value Date   WBC 47.6 (H) 07/11/2019   HGB 10.7 (L) 07/11/2019   HCT 33.4 (L) 07/11/2019   MCV 104.4 (H) 07/11/2019   PLT 100 (L) 07/11/2019      Chemistry      Component Value Date/Time   NA 143 01/05/2019 1024   NA 140 07/08/2017 1022   K 4.6 01/05/2019 1024   K 4.3 07/08/2017 1022   CL 110 01/05/2019 1024   CO2 24 01/05/2019 1024   CO2 23 07/08/2017 1022   BUN 22 01/05/2019 1024   BUN 29.0 (H) 07/08/2017 1022   CREATININE 1.49 (H) 01/05/2019 1024   CREATININE 1.6 (H) 07/08/2017 1022      Component Value Date/Time   CALCIUM 8.1 (L) 01/05/2019 1024   CALCIUM 8.9 07/08/2017 1022   ALKPHOS 68 01/05/2019 1024   ALKPHOS 60 07/08/2017 1022   AST 17 01/05/2019 1024   AST 19 07/08/2017 1022   ALT 8 01/05/2019 1024   ALT 7 07/08/2017 1022   BILITOT 0.4 01/05/2019 1024   BILITOT 0.44 07/08/2017 1022       RADIOGRAPHIC STUDIES: No results found.  ASSESSMENT AND PLAN:  This is a very pleasant 85 years old white male with myeloproliferative disorder consistent with splenic marginal zone lymphoma and leukocytosis. The patient is currently on observation and he is feeling fine. CBC today showed further elevation of his total white blood count but stable hemoglobin and hematocrit. I discussed the lab  results with the patient and recommended for him to continue on observation with repeat CBC, comprehensive metabolic panel and LDH in 6 months. He was advised to call immediately if he has any concerning symptoms in the interval. The patient voices understanding of current disease status and treatment options and is in agreement with the current care plan. All questions were answered. The patient knows to call the clinic with any problems, questions or concerns. We can certainly see the patient much sooner if necessary. I spent 10 minutes counseling the patient face to face. The total time spent in the appointment was 15 minutes.  Disclaimer: This note was dictated with voice recognition software. Similar sounding words can inadvertently be transcribed and may not be corrected upon review.

## 2019-07-11 NOTE — Progress Notes (Signed)
Remote pacemaker transmission.   

## 2019-07-12 ENCOUNTER — Telehealth: Payer: Self-pay | Admitting: Internal Medicine

## 2019-07-12 NOTE — Telephone Encounter (Signed)
Scheduled appt per 9/15 los - mailed letter with appt date and time   

## 2019-09-26 ENCOUNTER — Ambulatory Visit (INDEPENDENT_AMBULATORY_CARE_PROVIDER_SITE_OTHER): Payer: Medicare Other | Admitting: *Deleted

## 2019-09-26 DIAGNOSIS — Z950 Presence of cardiac pacemaker: Secondary | ICD-10-CM

## 2019-09-26 LAB — CUP PACEART REMOTE DEVICE CHECK
Battery Remaining Longevity: 142 mo
Battery Voltage: 3.02 V
Brady Statistic AP VP Percent: 0.08 %
Brady Statistic AP VS Percent: 89.98 %
Brady Statistic AS VP Percent: 0.01 %
Brady Statistic AS VS Percent: 9.93 %
Brady Statistic RA Percent Paced: 90.87 %
Brady Statistic RV Percent Paced: 0.09 %
Date Time Interrogation Session: 20201201005125
Implantable Lead Implant Date: 20080930
Implantable Lead Implant Date: 20080930
Implantable Lead Location: 753859
Implantable Lead Location: 753860
Implantable Lead Model: 5076
Implantable Lead Model: 5076
Implantable Pulse Generator Implant Date: 20180912
Lead Channel Impedance Value: 304 Ohm
Lead Channel Impedance Value: 323 Ohm
Lead Channel Impedance Value: 399 Ohm
Lead Channel Impedance Value: 494 Ohm
Lead Channel Pacing Threshold Amplitude: 0.625 V
Lead Channel Pacing Threshold Amplitude: 1.375 V
Lead Channel Pacing Threshold Pulse Width: 0.4 ms
Lead Channel Pacing Threshold Pulse Width: 0.4 ms
Lead Channel Sensing Intrinsic Amplitude: 1.75 mV
Lead Channel Sensing Intrinsic Amplitude: 1.75 mV
Lead Channel Sensing Intrinsic Amplitude: 19.5 mV
Lead Channel Sensing Intrinsic Amplitude: 19.5 mV
Lead Channel Setting Pacing Amplitude: 1.5 V
Lead Channel Setting Pacing Amplitude: 2.75 V
Lead Channel Setting Pacing Pulse Width: 0.4 ms
Lead Channel Setting Sensing Sensitivity: 2.8 mV

## 2019-10-05 ENCOUNTER — Encounter (HOSPITAL_COMMUNITY): Payer: Medicare Other

## 2019-10-09 ENCOUNTER — Other Ambulatory Visit: Payer: Self-pay

## 2019-10-09 ENCOUNTER — Ambulatory Visit (INDEPENDENT_AMBULATORY_CARE_PROVIDER_SITE_OTHER): Payer: Medicare Other | Admitting: Cardiovascular Disease

## 2019-10-09 ENCOUNTER — Encounter: Payer: Self-pay | Admitting: Cardiovascular Disease

## 2019-10-09 VITALS — BP 106/56 | HR 82 | Ht 71.0 in | Wt 201.8 lb

## 2019-10-09 DIAGNOSIS — Z86711 Personal history of pulmonary embolism: Secondary | ICD-10-CM | POA: Diagnosis not present

## 2019-10-09 DIAGNOSIS — D479 Neoplasm of uncertain behavior of lymphoid, hematopoietic and related tissue, unspecified: Secondary | ICD-10-CM

## 2019-10-09 DIAGNOSIS — Z950 Presence of cardiac pacemaker: Secondary | ICD-10-CM | POA: Diagnosis not present

## 2019-10-09 DIAGNOSIS — I495 Sick sinus syndrome: Secondary | ICD-10-CM | POA: Diagnosis not present

## 2019-10-09 DIAGNOSIS — I471 Supraventricular tachycardia: Secondary | ICD-10-CM | POA: Diagnosis not present

## 2019-10-09 DIAGNOSIS — S32050D Wedge compression fracture of fifth lumbar vertebra, subsequent encounter for fracture with routine healing: Secondary | ICD-10-CM

## 2019-10-09 DIAGNOSIS — E785 Hyperlipidemia, unspecified: Secondary | ICD-10-CM

## 2019-10-09 DIAGNOSIS — Z95828 Presence of other vascular implants and grafts: Secondary | ICD-10-CM

## 2019-10-09 NOTE — Progress Notes (Signed)
Cardiology Office Note    Date:  10/11/2019   ID:  David [name], DOB [birth year], MRN [id number]  PCP:  [name], MD  Cardiologist:   Sanda Klein, MD   Chief Complaint  Patient presents with  . Pacemaker Check    History of Present Illness:  David [name] is a 85 y.o. male with sinus node dysfunction, paroxysmal atrial tachycardia and nonsustained ventricular tachycardia returning for follow-up. He has a dual-chamber permanent pacemaker (implanted 2008, generator change Medtronic Azure September 2018).  He has had an uneventful year from a cardiac point of view. The patient specifically denies any chest pain at rest exertion, dyspnea at rest or with exertion, orthopnea, paroxysmal nocturnal dyspnea, syncope, palpitations, focal neurological deficits, intermittent claudication, lower extremity edema, unexplained weight gain, cough, hemoptysis or wheezing.  Comprehensive interrogation of his dual-chamber Medtronic Azure XT device implanted in 2018 shows an estimated 11.7 years of longevity.  Atrial pacing occurs 88% of the time but he has less than 1% ventricular pacing.  The heart rate histogram distribution is appropriate (he is fairly sedentary).  Underlying rhythm today sinus bradycardia 57 bpm, competing with a V paced rhythm.  Introduced a sleep rate at 50 bpm to reduce the overall burden of pacing.  There have been no episodes of rapid ventricular rate since his last download.  Both the atrial and the ventricular lead are 5076, MRI compatible leads.  He has not had any recent bleeding problems or falls. In the remote past she did have a subdural hematoma while on warfarin therapy for pulmonary embolism after knee replacement surgery. He still has an inferior vena cava filter in place and is on maintenance warfarin therapy. Followed by Dr. 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  Previous cardiac workup: Echo 2011, myocardial perfusion study 2007  have not shown evidence of structural cardiac illness.  Past Medical History:  Diagnosis Date  . Dyslipidemia   . Kyphosis   . Nonsustained ventricular tachycardia ([facility])   . Osteoporosis    Severe  . Paroxysmal atrial fibrillation (HCC)   . Pulmonary embolism ([facility])    following a hip fracture and surgery. 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 Sinus node dysfunction (HCC)   . 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 Tachycardia-bradycardia syndrome ([facility]) 2008   a. s/p MDT dual chamber PPM     Past Surgical History:  Procedure Laterality Date  . PACEMAKER INSERTION     Medtronic Adapta model #ADDRO1, serial [id number] H  . PPM GENERATOR CHANGEOUT N/A 07/07/2017   Procedure: PPM GENERATOR CHANGEOUT;  Surgeon: Sanda Klein, MD;  Location: [facility];  Service: Cardiovascular;  Laterality: N/A;  . TOTAL HIP ARTHROPLASTY  2004    Current Medications: Outpatient Medications Prior to Visit  Medication Sig Dispense Refill  . acetaminophen (TYLENOL) 325 MG tablet Take 325-650 mg by mouth daily as needed for moderate pain.    Marland Kitchen alfuzosin (UROXATRAL) 10 MG 24 hr tablet Take 10 mg by mouth every other day. Every third day  0  . allopurinol (ZYLOPRIM) 100 MG tablet Take 100 mg by mouth daily.   0  . Calcium-Vitamin D-Vitamin K (CALCIUM + D + K PO) Take 500 mg by mouth daily.    . Cholecalciferol (VITAMIN D) 2000 UNITS tablet Take 2,000 Units by mouth daily.    . Coenzyme Q10 (CO Q-10) 100 MG CAPS Take 100 mg  by mouth daily.     Marland Kitchen EPINEPHrine 0.3 mg/0.3 mL IJ SOAJ injection Inject 0.3 mg into the muscle as needed (allergic reaction).     . fexofenadine (ALLEGRA) 180 MG tablet Take 180 mg by mouth daily as needed for allergies.     Marland Kitchen levothyroxine (SYNTHROID, LEVOTHROID) 25 MCG tablet Take 50 mcg by mouth daily.   1  . Multiple Vitamins-Minerals (PRESERVISION AREDS) CAPS Take 1 capsule by mouth 2 (two) times daily.       Marland Kitchen neomycin-bacitracin-polymyxin (NEOSPORIN) ointment Apply 1 application topically daily as needed for wound care.    . simvastatin (ZOCOR) 40 MG tablet Take 40 mg by mouth daily.  1  . sodium chloride (OCEAN) 0.65 % SOLN nasal spray Place 1 spray into both nostrils as needed for congestion.    . vitamin B-12 (CYANOCOBALAMIN) 1000 MCG tablet Take 1,000 mcg by mouth daily.    Marland Kitchen warfarin (COUMADIN) 5 MG tablet Take 5-7.5 mg by mouth daily.   0  . Zoster Vaccine Adjuvanted (SHINGRIX) injection Shingrix (PF) 50 mcg/0.5 mL intramuscular suspension, kit  inject 0.5 milliliter intramuscularly     No facility-administered medications prior to visit.     Allergies:   Bee venom   Social History   Socioeconomic History  . Marital status: Married    Spouse name: Not on file  . Number of children: Not on file  . Years of education: Not on file  . Highest education level: Not on file  Occupational History  . Not on file  Tobacco Use  . Smoking status: Former Smoker    Quit date: 09/25/1984    Years since quitting: 35.0  . Smokeless tobacco: Never Used  Substance and Sexual Activity  . Alcohol use: Yes  . Drug use: No  . Sexual activity: Never  Other Topics Concern  . Not on file  Social History Narrative  . Not on file   Social Determinants of Health   Financial Resource Strain:   . Difficulty of Paying Living Expenses: Not on file  Food Insecurity:   . Worried About Charity fundraiser in the Last Year: Not on file  . Ran Out of Food in the Last Year: Not on file  Transportation Needs:   . Lack of Transportation (Medical): Not on file  . Lack of Transportation (Non-Medical): Not on file  Physical Activity:   . Days of Exercise per Week: Not on file  . Minutes of Exercise per Session: Not on file  Stress:   . Feeling of Stress : Not on file  Social Connections:   . Frequency of Communication with Friends and Family: Not on file  . Frequency of Social Gatherings with Friends  and Family: Not on file  . Attends Religious Services: Not on file  . Active Member of Clubs or Organizations: Not on file  . Attends Archivist Meetings: Not on file  . Marital Status: Not on file     Family History:  The patient's family history includes Fainting in his sister; Heart attack in his father; Stroke in his maternal grandfather, maternal grandmother, and mother.   ROS:   Please see the history of present illness.    ROS all other systems are reviewed and are negative   PHYSICAL EXAM:   VS:  BP (!) 106/56   Pulse 82   Ht '5\' 11"'$  (1.803 m)   Wt 201 lb 12.8 oz (91.5 kg)   BMI 28.15 kg/m  General: Alert, oriented x3, no distress, very hard of hearing Head: no evidence of trauma, PERRL, EOMI, no exophtalmos or lid lag, no myxedema, no xanthelasma; normal ears, nose and oropharynx Neck: normal jugular venous pulsations and no hepatojugular reflux; brisk carotid pulses without delay and no carotid bruits Chest: clear to auscultation, no signs of consolidation by percussion or palpation, normal fremitus, symmetrical and full respiratory excursions.  Healthy left subclavian pacemaker site Cardiovascular: normal position and quality of the apical impulse, regular rhythm, normal first and second heart sounds, no murmurs, rubs or gallops Abdomen: no tenderness or distention, no masses by palpation, no abnormal pulsatility or arterial bruits, normal bowel sounds, no hepatosplenomegaly Extremities: no clubbing, cyanosis or edema; 2+ radial, ulnar and brachial pulses bilaterally; 2+ right femoral, posterior tibial and dorsalis pedis pulses; 2+ left femoral, posterior tibial and dorsalis pedis pulses; no subclavian or femoral bruits Neurological: grossly nonfocal Psych: Normal mood and affect    Wt Readings from Last 3 Encounters:  10/09/19 201 lb 12.8 oz (91.5 kg)  07/11/19 200 lb 6.4 oz (90.9 kg)  01/05/19 207 lb 9.6 oz (94.2 kg)      Studies/Labs Reviewed:    EKG:  EKG is ordered today.  It shows atrial paced, ventricular sensed rhythm with long AV delay around 275 ms.  No repolarization abnormalities.  QTc 409 ms  Recent Labs: 07/11/2019: ALT 6; BUN 23; Creatinine 1.64; Hemoglobin 10.7; Platelet Count 100; Potassium 4.3; Sodium 142   03/03/2019 Total cholesterol 197, HDL 29, LDL 62, triglycerides 79  Additional studies/ records that were reviewed today include:  Notes from oncology clinic visit on September 12  ASSESSMENT:    1. PAT (paroxysmal atrial tachycardia) ([facility])   2. SSS (sick sinus syndrome) ([facility])   3. Pacemaker   4. History of pulmonary embolism   5. Presence of inferior vena cava filter   6. Dyslipidemia (high LDL; low HDL)   7. Lymphoproliferative disorder (HCC)   8. Compression fracture of L5 vertebra with routine healing, subsequent encounter      PLAN:  In order of problems listed above:  1. SSS: Not pacemaker dependent.  Today his sinus rate is fairly close to the lower rate limit so introduces sleep rates to maybe reduce the amount of atrial pacing.  The heart rate histogram distribution appears appropriate. 2. PM: Normal device function (device is MRI conditional). 3. PAT: None seen recently. 4. NSVT: None seen on current download. 5. History of pulmonary embolism and chronic inferior vena cava filter: On long-term warfarin anticoagulation.  He has not had any serious bleeding problems. 6. HLP: On statin, excellent LDL, but also very low HDL.  He is unlikely to be, physically active due to his orthopedic problems.  Suggested some attempts at weight loss. 7. Lymphoproliferative disorder, splenic marginal zone lymphoma.  His most recent labs show marked leukocytosis (47.6K), but stable mild anemia of 10.7 and mild thrombocytopenia at 100 K.  Currently observation only, no active therapy. 8. Lumbar spine compression fracture: He has improved since last year, but the injury has led to significant reduction in his  overall activity level.    Medication Adjustments/Labs and Tests Ordered: Current medicines are reviewed at length with the patient today.  Concerns regarding medicines are outlined above.  Medication changes, Labs and Tests ordered today are listed in the Patient Instructions below. Patient Instructions  Medication Instructions:  No changes *If you need a refill on your cardiac medications before your next appointment, please call your pharmacy*  Lab Work: None ordered If you have labs (blood work) drawn today and your tests are completely normal, you will receive your results only by: Marland Kitchen MyChart Message (if you have MyChart) OR . A paper copy in the mail If you have any lab test that is abnormal or we need to change your treatment, we will call you to review the results.  Testing/Procedures: None ordered  Follow-Up: At [facility], you and your health needs are our priority.  As part of our continuing mission to provide you with exceptional heart care, we have created designated Provider Care Teams.  These Care Teams include your primary Cardiologist (physician) and Advanced Practice Providers (APPs -  Physician Assistants and Nurse Practitioners) who all work together to provide you with the care you need, when you need it.  Your next appointment:   12 month(s)  The format for your next appointment:   In Person  Provider:   Sanda Klein, MD      Signed, Sanda Klein, MD  10/11/2019 4:43 PM    [address], [place], [place]  [address] Phone: [phone]; Fax: [phone]

## 2019-10-09 NOTE — Patient Instructions (Signed)

## 2019-10-11 ENCOUNTER — Encounter: Payer: Self-pay | Admitting: Cardiovascular Disease

## 2019-10-12 ENCOUNTER — Other Ambulatory Visit (HOSPITAL_COMMUNITY): Payer: Self-pay | Admitting: *Deleted

## 2019-10-13 ENCOUNTER — Other Ambulatory Visit: Payer: Self-pay

## 2019-10-13 ENCOUNTER — Ambulatory Visit (HOSPITAL_COMMUNITY)
Admission: RE | Admit: 2019-10-13 | Discharge: 2019-10-13 | Disposition: A | Discharge status: Home / Self Care | Payer: Medicare Other | Source: Ambulatory Visit | Attending: Internal Medicine | Admitting: Internal Medicine

## 2019-10-13 DIAGNOSIS — M810 Age-related osteoporosis without current pathological fracture: Secondary | ICD-10-CM | POA: Diagnosis not present

## 2019-10-13 MED ORDER — DENOSUMAB 60 MG/ML ~~LOC~~ SOSY
PREFILLED_SYRINGE | SUBCUTANEOUS | Status: AC
  Filled 2019-10-13: qty 1

## 2019-10-13 MED ORDER — DENOSUMAB 60 MG/ML ~~LOC~~ SOSY
60.0000 mg | PREFILLED_SYRINGE | Freq: Once | SUBCUTANEOUS | Status: AC
  Administered 2019-10-13: 10:00:00 60 mg via SUBCUTANEOUS

## 2019-10-21 NOTE — Progress Notes (Signed)
PPM remote 

## 2019-10-25 ENCOUNTER — Other Ambulatory Visit: Payer: Self-pay | Admitting: Cardiovascular Disease

## 2019-12-22 ENCOUNTER — Ambulatory Visit: Payer: Medicare PPO | Attending: Internal Medicine

## 2019-12-22 DIAGNOSIS — Z23 Encounter for immunization: Secondary | ICD-10-CM

## 2019-12-22 NOTE — Progress Notes (Signed)
   Covid-19 Vaccination Clinic  Name:  David [name]    MRN: [id number] DOB: 19-Aug-1926  12/22/2019  [name] was observed post Covid-19 immunization for 15 minutes without incidence. He was provided with Vaccine Information Sheet and instruction to access the V-Safe system.   [name] was instructed to call 911 with any severe reactions post vaccine: Marland Kitchen Difficulty breathing  . Swelling of your face and throat  . A fast heartbeat  . A bad rash all over your body  . Dizziness and weakness    Immunizations Administered    Name Date Dose VIS Date Route   Pfizer COVID-19 Vaccine 12/22/2019  8:59 AM 0.3 mL 10/06/2019 Intramuscular   Manufacturer: [place]   Lot: [id number]   [place]: [id number]

## 2019-12-26 ENCOUNTER — Ambulatory Visit (INDEPENDENT_AMBULATORY_CARE_PROVIDER_SITE_OTHER): Payer: Medicare PPO | Admitting: *Deleted

## 2019-12-26 DIAGNOSIS — Z950 Presence of cardiac pacemaker: Secondary | ICD-10-CM | POA: Diagnosis not present

## 2019-12-26 LAB — CUP PACEART REMOTE DEVICE CHECK
Battery Remaining Longevity: 140 mo
Battery Voltage: 3.02 V
Brady Statistic AP VP Percent: 0.05 %
Brady Statistic AP VS Percent: 84.43 %
Brady Statistic AS VP Percent: 0.03 %
Brady Statistic AS VS Percent: 15.49 %
Brady Statistic RA Percent Paced: 85.13 %
Brady Statistic RV Percent Paced: 0.08 %
Date Time Interrogation Session: 20210302004622
Implantable Lead Implant Date: 20080930
Implantable Lead Implant Date: 20080930
Implantable Lead Location: 753859
Implantable Lead Location: 753860
Implantable Lead Model: 5076
Implantable Lead Model: 5076
Implantable Pulse Generator Implant Date: 20180912
Lead Channel Impedance Value: 304 Ohm
Lead Channel Impedance Value: 342 Ohm
Lead Channel Impedance Value: 418 Ohm
Lead Channel Impedance Value: 532 Ohm
Lead Channel Pacing Threshold Amplitude: 0.625 V
Lead Channel Pacing Threshold Amplitude: 1.375 V
Lead Channel Pacing Threshold Pulse Width: 0.4 ms
Lead Channel Pacing Threshold Pulse Width: 0.4 ms
Lead Channel Sensing Intrinsic Amplitude: 2.625 mV
Lead Channel Sensing Intrinsic Amplitude: 2.625 mV
Lead Channel Sensing Intrinsic Amplitude: 21.25 mV
Lead Channel Sensing Intrinsic Amplitude: 21.25 mV
Lead Channel Setting Pacing Amplitude: 1.5 V
Lead Channel Setting Pacing Amplitude: 2.75 V
Lead Channel Setting Pacing Pulse Width: 0.4 ms
Lead Channel Setting Sensing Sensitivity: 2.8 mV

## 2019-12-27 NOTE — Progress Notes (Signed)
PPM Remote  

## 2020-01-09 ENCOUNTER — Inpatient Hospital Stay: Payer: Medicare PPO | Attending: Internal Medicine | Admitting: Internal Medicine

## 2020-01-09 ENCOUNTER — Other Ambulatory Visit: Payer: Self-pay

## 2020-01-09 ENCOUNTER — Inpatient Hospital Stay: Payer: Medicare PPO

## 2020-01-09 ENCOUNTER — Telehealth: Payer: Self-pay | Admitting: Internal Medicine

## 2020-01-09 ENCOUNTER — Encounter: Payer: Self-pay | Admitting: Internal Medicine

## 2020-01-09 VITALS — BP 139/61 | HR 74 | Temp 98.0°F | Resp 17 | Ht 71.0 in | Wt 197.0 lb

## 2020-01-09 DIAGNOSIS — Z7901 Long term (current) use of anticoagulants: Secondary | ICD-10-CM | POA: Diagnosis not present

## 2020-01-09 DIAGNOSIS — D479 Neoplasm of uncertain behavior of lymphoid, hematopoietic and related tissue, unspecified: Secondary | ICD-10-CM

## 2020-01-09 DIAGNOSIS — R5383 Other fatigue: Secondary | ICD-10-CM | POA: Diagnosis not present

## 2020-01-09 DIAGNOSIS — C946 Myelodysplastic disease, not classified: Secondary | ICD-10-CM | POA: Insufficient documentation

## 2020-01-09 DIAGNOSIS — Z79899 Other long term (current) drug therapy: Secondary | ICD-10-CM | POA: Diagnosis not present

## 2020-01-09 DIAGNOSIS — C8307 Small cell B-cell lymphoma, spleen: Secondary | ICD-10-CM

## 2020-01-09 DIAGNOSIS — M6281 Muscle weakness (generalized): Secondary | ICD-10-CM | POA: Insufficient documentation

## 2020-01-09 DIAGNOSIS — Z86711 Personal history of pulmonary embolism: Secondary | ICD-10-CM | POA: Insufficient documentation

## 2020-01-09 DIAGNOSIS — Z8572 Personal history of non-Hodgkin lymphomas: Secondary | ICD-10-CM | POA: Diagnosis not present

## 2020-01-09 LAB — CBC WITH DIFFERENTIAL (CANCER CENTER ONLY)
Abs Immature Granulocytes: 0.07 10*3/uL (ref 0.00–0.07)
Basophils Absolute: 0 10*3/uL (ref 0.0–0.1)
Basophils Relative: 0 %
Eosinophils Absolute: 0.3 10*3/uL (ref 0.0–0.5)
Eosinophils Relative: 1 %
HCT: 34 % — ABNORMAL LOW (ref 39.0–52.0)
Hemoglobin: 10.6 g/dL — ABNORMAL LOW (ref 13.0–17.0)
Immature Granulocytes: 0 %
Lymphocytes Relative: 86 %
Lymphs Abs: 41 10*3/uL — ABNORMAL HIGH (ref 0.7–4.0)
MCH: 32.1 pg (ref 26.0–34.0)
MCHC: 31.2 g/dL (ref 30.0–36.0)
MCV: 103 fL — ABNORMAL HIGH (ref 80.0–100.0)
Monocytes Absolute: 3.1 10*3/uL — ABNORMAL HIGH (ref 0.1–1.0)
Monocytes Relative: 7 %
Neutro Abs: 3 10*3/uL (ref 1.7–7.7)
Neutrophils Relative %: 6 %
Platelet Count: 94 10*3/uL — ABNORMAL LOW (ref 150–400)
RBC: 3.3 MIL/uL — ABNORMAL LOW (ref 4.22–5.81)
RDW: 14.6 % (ref 11.5–15.5)
WBC Count: 47.4 10*3/uL — ABNORMAL HIGH (ref 4.0–10.5)
nRBC: 0 % (ref 0.0–0.2)

## 2020-01-09 LAB — CMP (CANCER CENTER ONLY)
ALT: 10 U/L (ref 0–44)
AST: 26 U/L (ref 15–41)
Albumin: 3.4 g/dL — ABNORMAL LOW (ref 3.5–5.0)
Alkaline Phosphatase: 86 U/L (ref 38–126)
Anion gap: 7 (ref 5–15)
BUN: 23 mg/dL (ref 8–23)
CO2: 25 mmol/L (ref 22–32)
Calcium: 8.2 mg/dL — ABNORMAL LOW (ref 8.9–10.3)
Chloride: 108 mmol/L (ref 98–111)
Creatinine: 1.51 mg/dL — ABNORMAL HIGH (ref 0.61–1.24)
GFR, Est AFR Am: 46 mL/min — ABNORMAL LOW (ref >60)
GFR, Estimated: 40 mL/min — ABNORMAL LOW (ref >60)
Glucose, Bld: 97 mg/dL (ref 70–99)
Potassium: 4.4 mmol/L (ref 3.5–5.1)
Sodium: 140 mmol/L (ref 135–145)
Total Bilirubin: 0.4 mg/dL (ref 0.3–1.2)
Total Protein: 6.2 g/dL — ABNORMAL LOW (ref 6.5–8.1)

## 2020-01-09 LAB — LACTATE DEHYDROGENASE: LDH: 234 U/L — ABNORMAL HIGH (ref 98–192)

## 2020-01-09 NOTE — Telephone Encounter (Signed)
Scheduled per los. Gave avs and calendar  

## 2020-01-09 NOTE — Progress Notes (Signed)
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  PRIOR THERAPY: None  CURRENT THERAPY: Observation.  INTERVAL HISTORY: David [name] 86 y.o. male returns to the clinic today for follow-up visit.  The patient is feeling fine today with no concerning complaints except for mild fatigue.  He also has some diaper rash.  He denied having chest pain, shortness of breath, cough or hemoptysis.  He denied having any weight loss or night sweats.  He has no nausea, vomiting, diarrhea or constipation.  He has no headache or visual changes.  He is here today for evaluation and repeat blood work.  MEDICAL HISTORY: Past Medical History:  Diagnosis Date  . Dyslipidemia   . Kyphosis   . Nonsustained ventricular tachycardia ([facility])   . Osteoporosis    Severe  . Paroxysmal atrial fibrillation (HCC)   . Pulmonary embolism ([facility])    following a hip fracture and surgery. 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 Sinus node dysfunction (HCC)   . 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 Tachycardia-bradycardia syndrome ([facility]) 2008   a. s/p MDT dual chamber PPM     ALLERGIES:  is allergic to bee venom.  MEDICATIONS:  Current Outpatient Medications  Medication Sig Dispense Refill  . acetaminophen (TYLENOL) 325 MG tablet Take 325-650 mg by mouth daily as needed for moderate pain.    Marland Kitchen alfuzosin (UROXATRAL) 10 MG 24 hr tablet Take 10 mg by mouth every other day. Every third day  0  . allopurinol (ZYLOPRIM) 100 MG tablet Take 100 mg by mouth daily.   0  . Calcium-Vitamin D-Vitamin K (CALCIUM + D + K PO) Take 500 mg by mouth daily.    . Cholecalciferol (VITAMIN D) 2000 UNITS tablet Take 2,000 Units by mouth daily.    . Coenzyme Q10 (CO Q-10) 100 MG  CAPS Take 100 mg by mouth daily.     . fexofenadine (ALLEGRA) 180 MG tablet Take 180 mg by mouth daily as needed for allergies.     Marland Kitchen levothyroxine (SYNTHROID, LEVOTHROID) 25 MCG tablet Take 50 mcg by mouth daily.   1  . Multiple Vitamins-Minerals (PRESERVISION AREDS) CAPS Take 1 capsule by mouth 2 (two) times daily.     Marland Kitchen neomycin-bacitracin-polymyxin (NEOSPORIN) ointment Apply 1 application topically daily as needed for wound care.    . simvastatin (ZOCOR) 40 MG tablet Take 40 mg by mouth daily.  1  . sodium chloride (OCEAN) 0.65 % SOLN nasal spray Place 1 spray into both nostrils as needed for congestion.    . vitamin B-12 (CYANOCOBALAMIN) 1000 MCG tablet Take 1,000 mcg by mouth daily.    Marland Kitchen warfarin (COUMADIN) 5 MG tablet Take 5-7.5 mg by mouth daily.   0  . EPINEPHrine 0.3 mg/0.3 mL IJ SOAJ injection Inject 0.3 mg into the muscle as needed (allergic reaction).     . Zoster Vaccine Adjuvanted [facility]) injection Shingrix (PF) 50 mcg/0.5 mL intramuscular suspension, kit  inject 0.5 milliliter intramuscularly     No current facility-administered medications for this visit.    SURGICAL HISTORY:  Past Surgical History:  Procedure Laterality Date  . PACEMAKER INSERTION     Medtronic Adapta model #ADDRO1, serial [id number]  . PPM GENERATOR CHANGEOUT N/A 07/07/2017   Procedure: PPM GENERATOR CHANGEOUT;  Surgeon: Sanda Klein, MD;  Location: [facility];  Service: Cardiovascular;  Laterality: N/A;  . TOTAL HIP ARTHROPLASTY  2004    REVIEW OF SYSTEMS:  A comprehensive review of systems was negative except for: Constitutional: positive for fatigue Musculoskeletal: positive for muscle weakness   PHYSICAL EXAMINATION: General appearance: alert, cooperative, fatigued and no distress Head: Normocephalic, without obvious abnormality, atraumatic Neck: no adenopathy, no JVD, supple, symmetrical, trachea midline and thyroid not enlarged, symmetric, no tenderness/mass/nodules Lymph nodes:  Cervical, supraclavicular, and axillary nodes normal. Resp: clear to auscultation bilaterally Back: symmetric, no curvature. ROM normal. No CVA tenderness. Cardio: regular rate and rhythm, S1, S2 normal, no murmur, click, rub or gallop GI: soft, non-tender; bowel sounds normal; no masses,  no organomegaly Extremities: extremities normal, atraumatic, no cyanosis or edema  ECOG PERFORMANCE STATUS: 1 - Symptomatic but completely ambulatory  Blood pressure 139/61, pulse 74, temperature 98 F (36.7 C), temperature source Oral, resp. rate 17, height 5' 11"  (1.803 m), weight 197 lb (89.4 kg), SpO2 92 %.  LABORATORY DATA: Lab Results  Component Value Date   WBC 47.4 (H) 01/09/2020   HGB 10.6 (L) 01/09/2020   HCT 34.0 (L) 01/09/2020   MCV 103.0 (H) 01/09/2020   PLT 94 (L) 01/09/2020      Chemistry      Component Value Date/Time   NA 142 07/11/2019 1039   NA 140 07/08/2017 1022   K 4.3 07/11/2019 1039   K 4.3 07/08/2017 1022   CL 110 07/11/2019 1039   CO2 26 07/11/2019 1039   CO2 23 07/08/2017 1022   BUN 23 07/11/2019 1039   BUN 29.0 (H) 07/08/2017 1022   CREATININE 1.64 (H) 07/11/2019 1039   CREATININE 1.6 (H) 07/08/2017 1022      Component Value Date/Time   CALCIUM 9.2 07/11/2019 1039   CALCIUM 8.9 07/08/2017 1022   ALKPHOS 66 07/11/2019 1039   ALKPHOS 60 07/08/2017 1022   AST 17 07/11/2019 1039   AST 19 07/08/2017 1022   ALT 6 07/11/2019 1039   ALT 7 07/08/2017 1022   BILITOT 0.4 07/11/2019 1039   BILITOT 0.44 07/08/2017 1022       RADIOGRAPHIC STUDIES: CUP PACEART REMOTE DEVICE CHECK  Result Date: 12/26/2019 Scheduled remote reviewed.  Normal device function.  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 The patient is feeling fine today with no concerning complaints except for fatigue. CBC  today showed a stable white blood count, hemoglobin and hematocrit as well as platelets count. I recommended for the patient to continue on observation with repeat CBC, comprehensive metabolic panel and LDH in 1 year.  He will continue his routine follow-up visit for other issues with his primary care physician. The patient was advised to call immediately if he has any concerning symptoms in the interval. The patient voices understanding of current disease status and treatment options and is in agreement with the current care plan. All questions were answered. The patient knows to call the clinic with any problems, questions or concerns. We can certainly see the patient much sooner if necessary.  Disclaimer: This note was dictated with voice recognition software. Similar sounding words can inadvertently be transcribed and may not be corrected upon review.

## 2020-01-16 ENCOUNTER — Ambulatory Visit: Payer: Medicare PPO | Attending: Internal Medicine

## 2020-01-16 DIAGNOSIS — Z23 Encounter for immunization: Secondary | ICD-10-CM

## 2020-01-16 NOTE — Progress Notes (Signed)
   Covid-19 Vaccination Clinic  Name:  David [name]    MRN: [id number] DOB: [birth year]  01/16/2020  [name] was observed post Covid-19 immunization for 15 minutes without incident. He was provided with Vaccine Information Sheet and instruction to access the V-Safe system.   [name] was instructed to call 911 with any severe reactions post vaccine: Marland Kitchen Difficulty breathing  . Swelling of face and throat  . A fast heartbeat  . A bad rash all over body  . Dizziness and weakness   Immunizations Administered    Name Date Dose VIS Date Route   Pfizer COVID-19 Vaccine 01/16/2020 10:49 AM 0.3 mL 10/06/2019 Intramuscular   Manufacturer: [place]   Lot: [id number]   [place]: [id number]

## 2020-01-29 DIAGNOSIS — Z7901 Long term (current) use of anticoagulants: Secondary | ICD-10-CM | POA: Diagnosis not present

## 2020-01-30 DIAGNOSIS — H353131 Nonexudative age-related macular degeneration, bilateral, early dry stage: Secondary | ICD-10-CM | POA: Diagnosis not present

## 2020-01-30 DIAGNOSIS — H35371 Puckering of macula, right eye: Secondary | ICD-10-CM | POA: Diagnosis not present

## 2020-01-30 DIAGNOSIS — Z961 Presence of intraocular lens: Secondary | ICD-10-CM | POA: Diagnosis not present

## 2020-01-30 DIAGNOSIS — H52203 Unspecified astigmatism, bilateral: Secondary | ICD-10-CM | POA: Diagnosis not present

## 2020-02-05 DIAGNOSIS — Z7901 Long term (current) use of anticoagulants: Secondary | ICD-10-CM | POA: Diagnosis not present

## 2020-02-08 DIAGNOSIS — I469 Cardiac arrest, cause unspecified: Secondary | ICD-10-CM | POA: Diagnosis not present

## 2020-02-24 DIAGNOSIS — 419620001 Death: Secondary | SNOMED CT | POA: Diagnosis not present

## 2020-02-24 DEATH — deceased

## 2021-01-08 ENCOUNTER — Other Ambulatory Visit: Payer: Medicare PPO

## 2021-01-08 ENCOUNTER — Ambulatory Visit: Payer: Medicare PPO | Admitting: Internal Medicine
